# Patient Record
Sex: Female | Born: 1935 | Race: White | Hispanic: No | Smoking: Former smoker
Health system: Southern US, Community
[De-identification: ages and names within clinical notes are randomized; demographics above are authoritative.]

## PROBLEM LIST (undated history)

## (undated) DIAGNOSIS — J45909 Unspecified asthma, uncomplicated: Secondary | ICD-10-CM

## (undated) DIAGNOSIS — J449 Chronic obstructive pulmonary disease, unspecified: Secondary | ICD-10-CM

## (undated) DIAGNOSIS — M1990 Unspecified osteoarthritis, unspecified site: Secondary | ICD-10-CM

## (undated) DIAGNOSIS — F0390 Unspecified dementia without behavioral disturbance: Secondary | ICD-10-CM

## (undated) DIAGNOSIS — I10 Essential (primary) hypertension: Secondary | ICD-10-CM

## (undated) HISTORY — PX: ABDOMINAL HYSTERECTOMY: SHX81

## (undated) HISTORY — PX: BREAST SURGERY: SHX581

## (undated) HISTORY — PX: APPENDECTOMY: SHX54

## (undated) HISTORY — PX: SPINE SURGERY: SHX786

## (undated) HISTORY — PX: APPENDECTOMY (OPEN): SHX54

## (undated) HISTORY — PX: HYSTERECTOMY: SHX81

---

## 2005-07-26 ENCOUNTER — Ambulatory Visit: Payer: Self-pay | Admitting: Cardiology

## 2005-08-10 ENCOUNTER — Ambulatory Visit: Payer: Self-pay | Admitting: Cardiology

## 2005-08-27 ENCOUNTER — Ambulatory Visit: Payer: Self-pay | Admitting: Cardiology

## 2011-06-26 ENCOUNTER — Emergency Department (HOSPITAL_COMMUNITY)
Admission: EM | Admit: 2011-06-26 | Discharge: 2011-06-26 | Disposition: A | Discharge status: Home / Self Care | Payer: Medicare Other | Attending: Emergency Medicine | Admitting: Emergency Medicine

## 2011-06-26 ENCOUNTER — Emergency Department (HOSPITAL_COMMUNITY): Payer: Medicare Other

## 2011-06-26 DIAGNOSIS — W19XXXA Unspecified fall, initial encounter: Secondary | ICD-10-CM

## 2011-06-26 DIAGNOSIS — M5030 Other cervical disc degeneration, unspecified cervical region: Secondary | ICD-10-CM | POA: Insufficient documentation

## 2011-06-26 DIAGNOSIS — M79609 Pain in unspecified limb: Secondary | ICD-10-CM | POA: Insufficient documentation

## 2011-06-26 DIAGNOSIS — S0101XA Laceration without foreign body of scalp, initial encounter: Secondary | ICD-10-CM

## 2011-06-26 DIAGNOSIS — IMO0002 Reserved for concepts with insufficient information to code with codable children: Secondary | ICD-10-CM | POA: Insufficient documentation

## 2011-06-26 DIAGNOSIS — G319 Degenerative disease of nervous system, unspecified: Secondary | ICD-10-CM | POA: Insufficient documentation

## 2011-06-26 DIAGNOSIS — Z23 Encounter for immunization: Secondary | ICD-10-CM | POA: Insufficient documentation

## 2011-06-26 DIAGNOSIS — R079 Chest pain, unspecified: Secondary | ICD-10-CM | POA: Insufficient documentation

## 2011-06-26 DIAGNOSIS — S0100XA Unspecified open wound of scalp, initial encounter: Secondary | ICD-10-CM | POA: Insufficient documentation

## 2011-06-26 DIAGNOSIS — N949 Unspecified condition associated with female genital organs and menstrual cycle: Secondary | ICD-10-CM | POA: Insufficient documentation

## 2011-06-26 DIAGNOSIS — W010XXA Fall on same level from slipping, tripping and stumbling without subsequent striking against object, initial encounter: Secondary | ICD-10-CM | POA: Insufficient documentation

## 2011-06-26 DIAGNOSIS — S20219A Contusion of unspecified front wall of thorax, initial encounter: Secondary | ICD-10-CM

## 2011-06-26 DIAGNOSIS — S6000XA Contusion of unspecified finger without damage to nail, initial encounter: Secondary | ICD-10-CM | POA: Insufficient documentation

## 2011-06-26 DIAGNOSIS — Z7982 Long term (current) use of aspirin: Secondary | ICD-10-CM | POA: Insufficient documentation

## 2011-06-26 MED ORDER — TETANUS-DIPHTH-ACELL PERTUSSIS 5-2.5-18.5 LF-MCG/0.5 IM SUSP
0.5000 mL | Freq: Once | INTRAMUSCULAR | Status: AC
  Administered 2011-06-26: 0.5 mL via INTRAMUSCULAR
  Filled 2011-06-26: qty 0.5

## 2011-06-26 NOTE — ED Notes (Signed)
EDP in . Pt removed from back board. Collar left in place.

## 2011-06-26 NOTE — ED Notes (Signed)
Patient assisted with female urinal. No other needs voiced at this time.

## 2011-06-26 NOTE — ED Provider Notes (Signed)
History     CSN: [id number]  Arrival date & time 06/26/11  1242   First MD Initiated Contact with Patient 06/26/11 1325      Chief Complaint  Patient presents with  . Fall    (Consider location/radiation/quality/duration/timing/severity/associated sxs/prior treatment) Patient is a 78 y.o. female presenting with fall. The history is provided by the patient.  Fall  She tripped and fell as she was getting into her car and struck her head causing a laceration. She is complaining of pain in the left rib cage and also bruising to the left fourth finger. She denies loss of consciousness. Pain is moderate she rates it 5/10. She denies any nausea or vomiting. She does not on her last tetanus immunization was. She denies back pain and abdominal pain. She was treated by EMS by a full spinal immobilization and stabilization for transport.  No past medical history on file.  No past surgical history on file.  No family history on file.  History  Substance Use Topics  . Smoking status: Not on file  . Smokeless tobacco: Not on file  . Alcohol Use: Not on file    OB History    No data available      Review of Systems  All other systems reviewed and are negative.    Allergies  Review of patient's allergies indicates not on file.  Home Medications  No current outpatient prescriptions on file.  BP 157/85  Pulse 104  Temp(Src) 97.4 F (36.3 C) (Oral)  Resp 18  Ht 5\' 2"  (1.575 m)  Wt 127 lb (57.607 kg)  BMI 23.23 kg/m2  SpO2 96%  Physical Exam  Nursing note and vitals reviewed. HENT:  Head:     78 year old female who is on a long spine board with stiff cervical collar in place. She is in no acute distress. No signs are significant for mild hypertension blood pressure 157/85, mild tachycardia with heart rate 104. Oxygen saturation is 96% which is normal. Head: There is a laceration in the left temporoparietal area with no other head trauma seen. PERRLA, EOMI. Oropharynx is  clear. No facial injuries seen. Neck is nontender. Back  has no tenderness along the thoracic or lumbar spine and there is no CVA tenderness. Lungs are clear without rales, wheezes, rhonchi. Chest has moderate tenderness throughout the left lower rib cage both anteriorly, posteriorly, and laterally a. Has regular rate and rhythm without murmur. Abdomen is soft, flat, nontender without masses or hepatosplenomegaly. There is mild tenderness to palpation over the pelvic brim. Extremities: Ecchymosis is noted over the left fourth finger middle phalanx with out any significant tenderness. No deformities are seen. There is a minor abrasion over the left fifth finger PIP joint. There is no cyanosis or edema. Skin: There is a hyperkeratotic rash of the right foot and ankle, and no other rashes are seen. Neurologic: Mental status is normal, cranial nerves are intact, there no focal motor or sensory deficits. Psychiatric: No abnormalities of mood or affect.  ED Course  LACERATION REPAIR Date/Time: 06/26/2011 4:44 PM Performed by: Dione Booze Authorized by: Preston Fleeting, Ollie Esty Consent: Verbal consent obtained. Written consent not obtained. Risks and benefits: risks, benefits and alternatives were discussed Consent given by: patient Patient understanding: patient states understanding of the procedure being performed Patient consent: the patient's understanding of the procedure matches consent given Procedure consent: procedure consent matches procedure scheduled Relevant documents: relevant documents present and verified Test results: test results available and properly labeled Site marked: the  operative site was marked Imaging studies: imaging studies available Required items: required blood products, implants, devices, and special equipment available Patient identity confirmed: verbally with patient and arm band Time out: Immediately prior to procedure a "time out" was called to verify the correct patient,  procedure, equipment, support staff and site/side marked as required. Body area: head/neck Location details: scalp Laceration length: 2 cm Foreign bodies: no foreign bodies Nerve involvement: none Vascular damage: no Patient sedated: no Preparation: Patient was prepped and draped in the usual sterile fashion. Amount of cleaning: standard Debridement: none Degree of undermining: none Skin closure: staples Number of sutures: 4 Technique: simple Approximation: close Approximation difficulty: simple Dressing: pressure dressing Patient tolerance: Patient tolerated the procedure well with no immediate complications. Comments: When the pressure dressing was removed, she was noted to ask he have 2 parallel lacerations with significant contusion of the skin. Underlying hematoma was expressed out through the laceration and then the laceration was closed. She continued to have oozing of blood in spite of stapling so a pressure dressing was reapplied. She does take daily aspirin which is probably the reason that she is continuing to have bleeding in spite of good closure.   (including critical care time)  No results found for this or any previous visit. Dg Ribs Unilateral W/chest Left  06/26/2011  *RADIOLOGY REPORT*  Clinical Data: History of fall with left anterior rib pain.  LEFT RIBS AND CHEST - 3+ VIEW  Comparison: None.  Findings: Lung volumes are low, and there is mild elevation of the right hemidiaphragm.  No consolidative airspace disease.  No pleural effusions.  Mild diffuse interstitial prominence, with mild bronchial wall thickening (the chronicity of this finding is uncertain).  Heart size is normal.  Atherosclerosis of the thoracic aorta.  Three dedicated views of the left ribs demonstrate no definite acute displaced left rib rib fractures.  There is a well-formed callus in the lateral aspect of the left tenth rib, consistent with an old healed fracture.  Extensive costochondral  calcification noted.  IMPRESSION:  1.  No definite acute displaced left rib fractures.  An old healed fracture of the lateral aspect of the left tenth rib is noted.  If there continues be clinical concern for fracture, despite these negative findings, repeat radiographs in 10-14 days may be of use, as acute nondisplaced rib fractures can be radiographically occult on initial images. 2.  Interstitial prominence and mild bronchial wall thickening throughout the lungs bilaterally.  The chronicity of these findings is uncertain, but in an individual of this age, these findings may be chronic.  However, correlation for signs and symptoms of acute bronchitis is recommended. 3.  Atherosclerosis.  Original Report Authenticated By: Florencia Reasons, M.D.   Dg Pelvis 1-2 Views  06/26/2011  *RADIOLOGY REPORT*  Clinical Data: History of fall with pain.  PELVIS - 1-2 VIEW  Comparison: None.  Findings: Single frontal view of the pelvis is limited in the upper pelvis by overlying bowel gas.  Well visualized portions of the bony pelvis appear intact.  Bilateral proximal femurs appear intact, and the femoral heads project over the acetabulum (proper location cannot be confirmed on this single frontal view). Joint space narrowing, subchondral sclerosis and osteophyte formation in the hip joints bilaterally consistent with osteoarthritis.  IMPRESSION:  1.  No acute abnormality of the bony pelvis noted. 2.  Osteoarthritis of the hip joints bilaterally.  Original Report Authenticated By: Florencia Reasons, M.D.   Ct Head Wo Contrast  06/26/2011  *  RADIOLOGY REPORT*  Clinical Data:  History of fall hitting the left side of the head. Head pain.  Denies loss of consciousness.  CT HEAD WITHOUT CONTRAST CT CERVICAL SPINE WITHOUT CONTRAST  Technique:  Multidetector CT imaging of the head and cervical spine was performed following the standard protocol without intravenous contrast.  Multiplanar CT image reconstructions of the cervical  spine were also generated.  Comparison:  None  CT HEAD  Findings: Large high attenuation collection in the left frontal scalp, consistent with a large scalp hematoma.  No underlying displaced skull fracture.  No acute intracranial abnormality. Specifically, no evidence of intra or extra-axial hemorrhage, mass, mass effect, hydrocephalus or overt changes of acute ischemia. Extensive cerebral and cerebellar atrophy noted.  Areas of decreased attenuation in the deep and periventricular white matter of the cerebral hemispheres bilaterally is most consistent with chronic microvascular ischemic changes.  Mild mucosal thickening in the ethmoid sinuses and the sphenoid sinuses bilaterally.  Mastoids are well aerated.  IMPRESSION:  1.  Large left frontal scalp hematoma without underlying displaced skull fracture or other acute intracranial abnormality. 2.  Cerebral and cerebellar atrophy, age appropriate. 3.  Chronic microvascular ischemic changes in the white matter, as above.  CT CERVICAL SPINE  Findings: No definite acute fracture or malalignment is noted. There is minimal anterolisthesis of C6 upon C7, likely chronic. Multilevel degenerative disc disease as demonstrated by disc space narrowing and sclerosis of the adjacent vertebral body endplates, most severe at C4-C5 and C5-C6.  Multilevel facet arthropathy.  At C4-C5 and there is a prominent posterior disc osteophyte complex which slightly narrows the central canal.  Some calcification of the dura is noted (best demonstrated and C7-T1 posteriorly).  IMPRESSION:  1.  No acute fracture or malalignment. 2.  Multilevel degenerative disc disease and cervical spondylosis, as detailed above.  Original Report Authenticated By: Florencia Reasons, M.D.   Ct Cervical Spine Wo Contrast  06/26/2011  *RADIOLOGY REPORT*  Clinical Data:  History of fall hitting the left side of the head. Head pain.  Denies loss of consciousness.  CT HEAD WITHOUT CONTRAST CT CERVICAL SPINE WITHOUT  CONTRAST  Technique:  Multidetector CT imaging of the head and cervical spine was performed following the standard protocol without intravenous contrast.  Multiplanar CT image reconstructions of the cervical spine were also generated.  Comparison:  None  CT HEAD  Findings: Large high attenuation collection in the left frontal scalp, consistent with a large scalp hematoma.  No underlying displaced skull fracture.  No acute intracranial abnormality. Specifically, no evidence of intra or extra-axial hemorrhage, mass, mass effect, hydrocephalus or overt changes of acute ischemia. Extensive cerebral and cerebellar atrophy noted.  Areas of decreased attenuation in the deep and periventricular white matter of the cerebral hemispheres bilaterally is most consistent with chronic microvascular ischemic changes.  Mild mucosal thickening in the ethmoid sinuses and the sphenoid sinuses bilaterally.  Mastoids are well aerated.  IMPRESSION:  1.  Large left frontal scalp hematoma without underlying displaced skull fracture or other acute intracranial abnormality. 2.  Cerebral and cerebellar atrophy, age appropriate. 3.  Chronic microvascular ischemic changes in the white matter, as above.  CT CERVICAL SPINE  Findings: No definite acute fracture or malalignment is noted. There is minimal anterolisthesis of C6 upon C7, likely chronic. Multilevel degenerative disc disease as demonstrated by disc space narrowing and sclerosis of the adjacent vertebral body endplates, most severe at C4-C5 and C5-C6.  Multilevel facet arthropathy.  At C4-C5 and there is  a prominent posterior disc osteophyte complex which slightly narrows the central canal.  Some calcification of the dura is noted (best demonstrated and C7-T1 posteriorly).  IMPRESSION:  1.  No acute fracture or malalignment. 2.  Multilevel degenerative disc disease and cervical spondylosis, as detailed above.  Original Report Authenticated By: Florencia Reasons, M.D.   Dg Hand Complete  Left  06/26/2011  *RADIOLOGY REPORT*  Clinical Data: History of fall complaining of left-sided hand pain.  LEFT HAND - COMPLETE 3+ VIEW  Comparison: None.  Findings: Mild osteopenia.  Multi focal joint space narrowing, subchondral sclerosis, osteophyte formation and subchondral cyst formation, most pronounced in the DIP joints, as well as the first carpal metacarpal joint and metacarpal phalangeal joint.  No displaced fracture or dislocation identified.  IMPRESSION:  1.  No acute fracture or dislocation. 2.  Degenerative changes of osteoarthritis, as above.  Original Report Authenticated By: Florencia Reasons, M.D.    Blood started spurting from her wound, so a pressure dressing was applied. She's returned from CT scan with no obvious injury other than the laceration, and a hematoma was noted at the site of the laceration.  She was reevaluated after the laceration repair and pressure dressing. There is no significant bleeding at the site and she will be sent home with a pressure dressing in place with instructions to remove it in the morning.   1. Fall   2. Scalp laceration   3. Contusion of chest wall       MDM  Fall with scalp laceration, chest wall injury, and ecchymosis of the finger of left hand. X-rays will be obtained. No indication of syncope or neurologic event.        Dione Booze, MD 06/26/11 1736

## 2011-06-26 NOTE — ED Notes (Signed)
Pt states she was attempting to get into her car and fell into it. Complain of pain in left rib area. Per EMS, pt has a large lac to the side of her head.

## 2011-06-26 NOTE — ED Notes (Signed)
Pressure dressing placed on patients head per MD request.

## 2011-06-26 NOTE — ED Notes (Signed)
Pt over for x-rays

## 2011-07-01 ENCOUNTER — Encounter (HOSPITAL_COMMUNITY): Payer: Self-pay

## 2016-11-11 ENCOUNTER — Emergency Department (HOSPITAL_COMMUNITY)
Admission: EM | Admit: 2016-11-11 | Discharge: 2016-11-11 | Disposition: A | Discharge status: Home / Self Care | Payer: Medicare Other | Attending: Emergency Medicine | Admitting: Emergency Medicine

## 2016-11-11 ENCOUNTER — Encounter (HOSPITAL_COMMUNITY): Payer: Self-pay | Admitting: Emergency Medicine

## 2016-11-11 ENCOUNTER — Emergency Department (HOSPITAL_COMMUNITY): Payer: Medicare Other

## 2016-11-11 DIAGNOSIS — J449 Chronic obstructive pulmonary disease, unspecified: Secondary | ICD-10-CM | POA: Insufficient documentation

## 2016-11-11 DIAGNOSIS — J45909 Unspecified asthma, uncomplicated: Secondary | ICD-10-CM | POA: Diagnosis not present

## 2016-11-11 DIAGNOSIS — Z7982 Long term (current) use of aspirin: Secondary | ICD-10-CM | POA: Insufficient documentation

## 2016-11-11 DIAGNOSIS — Z87891 Personal history of nicotine dependence: Secondary | ICD-10-CM | POA: Diagnosis not present

## 2016-11-11 DIAGNOSIS — M79672 Pain in left foot: Secondary | ICD-10-CM

## 2016-11-11 DIAGNOSIS — Z79899 Other long term (current) drug therapy: Secondary | ICD-10-CM | POA: Diagnosis not present

## 2016-11-11 HISTORY — DX: Unspecified osteoarthritis, unspecified site: M19.90

## 2016-11-11 HISTORY — DX: Unspecified asthma, uncomplicated: J45.909

## 2016-11-11 HISTORY — DX: Chronic obstructive pulmonary disease, unspecified: J44.9

## 2016-11-11 MED ORDER — IBUPROFEN 400 MG PO TABS: 400.0000 mg | ORAL_TABLET | Freq: Three times a day (TID) | ORAL | 0 refills | Status: AC | PRN

## 2016-11-11 MED ORDER — PREDNISONE 20 MG PO TABS: 20.0000 mg | ORAL_TABLET | Freq: Every day | ORAL | 0 refills | Status: AC

## 2016-11-11 MED ORDER — IBUPROFEN 400 MG PO TABS
400.0000 mg | ORAL_TABLET | Freq: Once | ORAL | Status: AC
Start: 2016-11-11 — End: 2016-11-11
  Administered 2016-11-11: 400 mg via ORAL
  Filled 2016-11-11: qty 1

## 2016-11-11 MED ORDER — PREDNISONE 20 MG PO TABS
40.0000 mg | ORAL_TABLET | Freq: Once | ORAL | Status: AC
  Administered 2016-11-11: 40 mg via ORAL
  Filled 2016-11-11: qty 2

## 2016-11-11 NOTE — ED Notes (Signed)
Pt unable to point or state where pain is located.  + pedal pulses in bilateral feet

## 2016-11-11 NOTE — ED Provider Notes (Signed)
[facility]-EMERGENCY DEPT Provider Note   CSN: [id number] Arrival date & time: 11/11/16  1356     History   Chief Complaint Chief Complaint  Patient presents with  . Foot Pain    HPI Jaime Bowman is a 83 y.o. female.  HPI   89yF with LLE pain. Daughter and care taker supplementing history. Pt began complaining of pain in L foot yesterday. Today having a lot of difficulty bearing weight because of pain. Uses walker at baseline. No trauma. No swelling or rash. No acute numbness or tingling. She has a hard time localizing the pain when I ask her. "It moves around." Daughter reports she reported pain in various locations in b/l LE but L foot is what she has been saying the most.   Past Medical History:  Diagnosis Date  . Arthritis   . Asthma   . COPD (chronic obstructive pulmonary disease) (HCC)     There are no active problems to display for this patient.   Past Surgical History:  Procedure Laterality Date  . ABDOMINAL HYSTERECTOMY    . APPENDECTOMY    . BREAST SURGERY Left     OB History    No data available       Home Medications    Prior to Admission medications   Medication Sig Start Date End Date Taking? Authorizing Provider  acetaminophen (TYLENOL) 500 MG tablet Take 1,000 mg by mouth every 6 (six) hours as needed. For pain   Yes [provider]  amLODipine (NORVASC) 5 MG tablet Take 5 mg by mouth daily.   Yes [provider]  aspirin EC 81 MG tablet Take 81 mg by mouth daily.   Yes [provider]  HYDROcodone-acetaminophen (NORCO) 10-325 MG per tablet Take 1 tablet by mouth every 8 (eight) hours as needed. For pain   Yes [provider]  ibuprofen (ADVIL,MOTRIN) 200 MG tablet Take 400 mg by mouth every 6 (six) hours as needed. For pain   Yes [provider]  isosorbide mononitrate (IMDUR) 30 MG 24 hr tablet Take 30 mg by mouth daily.   Yes [provider]  MYRBETRIQ 50 MG TB24 tablet Take 50 mg by mouth  daily.  09/24/16  Yes [provider]  VENTOLIN HFA 108 (90 Base) MCG/ACT inhaler Inhale 1-2 puffs into the lungs every 4 (four) hours as needed.  09/24/16  Yes [provider]    Family History No family history on file.  Social History Social History  Substance Use Topics  . Smoking status: Former Games developermoker  . Smokeless tobacco: Never Used  . Alcohol use Yes     Allergies   Food   Review of Systems Review of Systems  All systems reviewed and negative, other than as noted in HPI.  Physical Exam Updated Vital Signs BP (!) 135/52 (BP Location: Right Arm)   Pulse 94   Temp 99.1 F (37.3 C) (Oral)   Resp 18   Ht 5\' 2"  (1.575 m)   Wt 57.6 kg (127 lb)   SpO2 97%   BMI 23.23 kg/m   Physical Exam  Constitutional: She appears well-developed and well-nourished. No distress.  HENT:  Head: Normocephalic and atraumatic.  Eyes: Conjunctivae are normal. Right eye exhibits no discharge. Left eye exhibits no discharge.  Neck: Neck supple.  Cardiovascular: Normal rate, regular rhythm and normal heart sounds.  Exam reveals no gallop and no friction rub.   No murmur heard. Pulmonary/Chest: Effort normal. No respiratory distress. She has  wheezes.  Abdominal: Soft. She exhibits no distension. There is no tenderness.  Musculoskeletal: She exhibits no edema or tenderness.       Feet:  L foot is tender plantar surface in pictures area. No appreciable swelling. No redness, ecchymosis or other skin findings. NVI. No increased pain with ROM of ankle. Pain is exacerbated with dorsiflexion of toes.   Neurological: She is alert.  Skin: Skin is warm and dry.  Psychiatric: She has a normal mood and affect. Her behavior is normal. Thought content normal.  Nursing note and vitals reviewed.    ED Treatments / Results  Labs (all labs ordered are listed, but only abnormal results are displayed) Labs Reviewed - No data to display  EKG  EKG Interpretation None        Radiology No results found.   Dg Foot Complete Left  Result Date: 11/11/2016 CLINICAL DATA:  83 year old female with a history of left foot pain and no known injury EXAM: LEFT FOOT - COMPLETE 3+ VIEW COMPARISON:  None. FINDINGS: Osteopenia. No acute displaced fracture. Degenerative changes of the interphalangeal joints. No subluxation/ dislocation. Dense calcifications of the tibial and pedal vessels. Degenerative changes of the hindfoot and midfoot. IMPRESSION: Osteopenia, with no evidence of acute bony abnormality. Calcifications of the tibial and pedal vessels. Correlation with noninvasive arterial imaging may be useful if there is a concern for critical limb ischemia as an etiology of foot pain. Electronically Signed   By: Gilmer Mor D.O.   On: 11/11/2016 15:31    Procedures Procedures (including critical care time)  Medications Ordered in ED Medications - No data to display   Initial Impression / Assessment and Plan / ED Course  I have reviewed the triage vital signs and the nursing notes.  Pertinent labs & imaging results that were available during my care of the patient were reviewed by me and considered in my medical decision making (see chart for details).     89yF with atraumatic L foot pain. Plantar fasciitis? No trauma. Tender in expect location. NVI. Imaging w/o acute abnormality. Her exam is not consistent with arterial insufficiency. Also noted to having wheezing, but she seems comfortable and denies acute respiratory complaint. Imaging negative. Walking in ED with a walker. Appears reasonably comfortable doing this.   Final Clinical Impressions(s) / ED Diagnoses   Final diagnoses:  Left foot pain    New Prescriptions New Prescriptions   No medications on file     Raeford Razor, MD 11/15/16 1045

## 2016-11-11 NOTE — ED Triage Notes (Signed)
Pt reports l foot pain for a couple of weeks-  Assisted out of car with weight bearing to her L foot Pt is frail and with 2 daughters  Dr [place]arm [place]

## 2016-11-11 NOTE — ED Triage Notes (Signed)
Patient complains of left foot pain that started yesterday. Patient says that she is unable to walk due to the pain. NAD.

## 2016-11-12 NOTE — Care Management (Signed)
VM received on 6/18 for consult over weekend, chart reviewed, no description of CM need in consult order. No orders pertinent to CM. No completed note from EDP to identify CM needs. Pt discharged from ED 11/11/2016

## 2017-02-19 ENCOUNTER — Emergency Department: Payer: Medicare Other

## 2017-02-19 ENCOUNTER — Observation Stay
Admission: EM | Admit: 2017-02-19 | Discharge: 2017-02-20 | Disposition: A | Discharge status: Home Health | Payer: Medicare Other | Source: Ambulatory Visit | Attending: Internal Medicine | Admitting: Internal Medicine

## 2017-02-19 DIAGNOSIS — J45909 Unspecified asthma, uncomplicated: Secondary | ICD-10-CM

## 2017-02-19 DIAGNOSIS — Z87891 Personal history of nicotine dependence: Secondary | ICD-10-CM

## 2017-02-19 DIAGNOSIS — I10 Essential (primary) hypertension: Secondary | ICD-10-CM | POA: Insufficient documentation

## 2017-02-19 DIAGNOSIS — J181 Lobar pneumonia, unspecified organism: Secondary | ICD-10-CM

## 2017-02-19 DIAGNOSIS — M1990 Unspecified osteoarthritis, unspecified site: Secondary | ICD-10-CM | POA: Insufficient documentation

## 2017-02-19 DIAGNOSIS — J189 Pneumonia, unspecified organism: Principal | ICD-10-CM | POA: Diagnosis present

## 2017-02-19 DIAGNOSIS — Z9049 Acquired absence of other specified parts of digestive tract: Secondary | ICD-10-CM | POA: Insufficient documentation

## 2017-02-19 DIAGNOSIS — J441 Chronic obstructive pulmonary disease with (acute) exacerbation: Secondary | ICD-10-CM | POA: Insufficient documentation

## 2017-02-19 DIAGNOSIS — Z79899 Other long term (current) drug therapy: Secondary | ICD-10-CM | POA: Insufficient documentation

## 2017-02-19 DIAGNOSIS — F0390 Unspecified dementia without behavioral disturbance: Secondary | ICD-10-CM | POA: Insufficient documentation

## 2017-02-19 DIAGNOSIS — R0789 Other chest pain: Secondary | ICD-10-CM | POA: Insufficient documentation

## 2017-02-19 DIAGNOSIS — Z90710 Acquired absence of both cervix and uterus: Secondary | ICD-10-CM | POA: Insufficient documentation

## 2017-02-19 HISTORY — DX: Unspecified dementia, unspecified severity, without behavioral disturbance, psychotic disturbance, mood disturbance, and anxiety: F03.90

## 2017-02-19 HISTORY — DX: Essential (primary) hypertension: I10

## 2017-02-19 HISTORY — DX: Unspecified asthma, uncomplicated: J45.909

## 2017-02-19 HISTORY — DX: Unspecified osteoarthritis, unspecified site: M19.90

## 2017-02-19 LAB — COMPREHENSIVE METABOLIC PANEL
ALT: 8 U/L (ref 0–55)
AST (SGOT): 14 U/L (ref 10–42)
Albumin/Globulin Ratio: 0.71 Ratio (ref 0.70–1.50)
Albumin: 3.4 gm/dL — ABNORMAL LOW (ref 3.5–5.0)
Alkaline Phosphatase: 69 U/L (ref 40–145)
Anion Gap: 12.2 mMol/L (ref 7.0–18.0)
BUN / Creatinine Ratio: 21.4 Ratio (ref 10.0–30.0)
BUN: 18 mg/dL (ref 7–22)
Bilirubin, Total: 0.5 mg/dL (ref 0.1–1.2)
CO2: 26 mMol/L (ref 20.0–30.0)
Calcium: 9.4 mg/dL (ref 8.5–10.5)
Chloride: 106 mMol/L (ref 98–110)
Creatinine: 0.84 mg/dL (ref 0.60–1.20)
EGFR: 58 mL/min/{1.73_m2} — ABNORMAL LOW (ref 60–150)
Globulin: 4.8 gm/dL — ABNORMAL HIGH (ref 2.0–4.0)
Glucose: 94 mg/dL (ref 71–99)
Osmolality Calc: 282 mOsm/kg (ref 275–300)
Potassium: 3.8 mMol/L (ref 3.5–5.3)
Protein, Total: 8.2 gm/dL (ref 6.0–8.3)
Sodium: 140 mMol/L (ref 136–147)

## 2017-02-19 LAB — CBC AND DIFFERENTIAL
Bands: 1 % (ref 0–10)
Basophils %: 0 % (ref 0.0–3.0)
Basophils Absolute: 0 10*3/uL (ref 0.0–0.3)
Eosinophils %: 0 % (ref 0.0–7.0)
Eosinophils Absolute: 0 10*3/uL (ref 0.0–0.8)
Hematocrit: 33.1 % — ABNORMAL LOW (ref 36.0–48.0)
Hemoglobin: 11.1 gm/dL — ABNORMAL LOW (ref 12.0–16.0)
Lymphocytes Absolute: 2.7 10*3/uL (ref 0.6–5.1)
Lymphocytes: 50 % — ABNORMAL HIGH (ref 15.0–46.0)
MCH: 26 pg — ABNORMAL LOW (ref 28–35)
MCHC: 33 gm/dL (ref 31–36)
MCV: 78 fL — ABNORMAL LOW (ref 80–100)
MPV: 6.8 fL (ref 6.0–10.0)
Monocytes Absolute: 0.6 10*3/uL (ref 0.1–1.7)
Monocytes: 11 % (ref 3.0–15.0)
Neutrophils %: 38 % — ABNORMAL LOW (ref 42.0–78.0)
Neutrophils Absolute: 2.1 10*3/uL (ref 1.7–8.6)
PLT CT: 190 10*3/uL (ref 130–440)
RBC: 4.23 10*6/uL (ref 3.80–5.00)
RDW: 13.9 % (ref 10.5–14.5)
WBC: 5.3 10*3/uL (ref 4.0–11.0)

## 2017-02-19 LAB — VH URINALYSIS WITH MICROSCOPIC AND CULTURE IF INDICATED
Bilirubin, UA: NEGATIVE mg/dL
Blood, UA: NEGATIVE mg/dL
Glucose, UA: NEGATIVE mg/dL
Ketones UA: NEGATIVE mg/dL
Leukocyte Esterase, UA: NEGATIVE Leu/uL
Nitrite, UA: NEGATIVE
Protein, UR: NEGATIVE mg/dL
Urine Specific Gravity: 1.01 (ref 1.001–1.040)
Urobilinogen, UA: 0.2 mg/dL
pH, Urine: 6 pH (ref 5.0–8.0)

## 2017-02-19 LAB — ECG 12-LEAD
P Wave Axis: 1 deg
P-R Interval: 157 ms
Patient Age: 89 years
Q-T Interval(Corrected): 466 ms
Q-T Interval: 384 ms
QRS Axis: -56 deg
QRS Duration: 111 ms
T Axis: 63 years
Ventricular Rate: 88 //min

## 2017-02-19 LAB — TROPONIN I: Troponin I: 0 ng/mL (ref 0.00–0.02)

## 2017-02-19 LAB — D-DIMER, QUANTITATIVE: D-Dimer: 1.49 mg/L FEU — ABNORMAL HIGH (ref 0.19–0.52)

## 2017-02-19 LAB — B-TYPE NATRIURETIC PEPTIDE: B-Natriuretic Peptide: 64.2 pg/mL (ref 0.0–100.0)

## 2017-02-19 MED ORDER — CEFTRIAXONE SODIUM-DEXTROSE 1-3.74 GM-% IV SOLR
1.0000 g | INTRAVENOUS | Status: DC
Start: 2017-02-20 — End: 2017-02-19

## 2017-02-19 MED ORDER — ASPIRIN 81 MG PO TBEC
81.0000 mg | DELAYED_RELEASE_TABLET | Freq: Every day | ORAL | Status: DC
Start: 2017-02-19 — End: 2017-02-20
  Administered 2017-02-20: 09:00:00 81 mg via ORAL
  Filled 2017-02-19: qty 1

## 2017-02-19 MED ORDER — IODIXANOL 320 MG/ML IV SOLN
90.0000 mL | Freq: Once | INTRAVENOUS | Status: AC | PRN
Start: 2017-02-19 — End: 2017-02-19
  Administered 2017-02-19: 13:00:00 90 mL via INTRAVENOUS

## 2017-02-19 MED ORDER — AZITHROMYCIN 500 MG IV SOLR
INTRAVENOUS | Status: AC
Start: 2017-02-19 — End: ?
  Filled 2017-02-19: qty 500

## 2017-02-19 MED ORDER — SODIUM CHLORIDE 0.9 % IV SOLN
INTRAVENOUS | Status: AC
Start: 2017-02-19 — End: ?
  Filled 2017-02-19: qty 100

## 2017-02-19 MED ORDER — ACETAMINOPHEN 325 MG PO TABS
650.0000 mg | ORAL_TABLET | ORAL | Status: DC | PRN
Start: 2017-02-19 — End: 2017-02-20

## 2017-02-19 MED ORDER — SODIUM CHLORIDE 0.9 % IV MBP
1.0000 g | INTRAVENOUS | Status: DC
Start: 2017-02-19 — End: 2017-02-19

## 2017-02-19 MED ORDER — METHYLPREDNISOLONE SODIUM SUCC 40 MG IJ SOLR
40.0000 mg | Freq: Two times a day (BID) | INTRAMUSCULAR | Status: DC
Start: 2017-02-19 — End: 2017-02-20
  Administered 2017-02-19 – 2017-02-20 (×2): 40 mg via INTRAVENOUS
  Filled 2017-02-19 (×2): qty 1

## 2017-02-19 MED ORDER — ENOXAPARIN SODIUM 40 MG/0.4ML SC SOLN
40.0000 mg | SUBCUTANEOUS | Status: DC
Start: 2017-02-19 — End: 2017-02-20
  Administered 2017-02-19: 18:00:00 40 mg via SUBCUTANEOUS
  Filled 2017-02-19: qty 0.4

## 2017-02-19 MED ORDER — ALBUTEROL-IPRATROPIUM 2.5-0.5 (3) MG/3ML IN SOLN
3.0000 mL | Freq: Four times a day (QID) | RESPIRATORY_TRACT | Status: DC
Start: 2017-02-19 — End: 2017-02-20
  Administered 2017-02-19 – 2017-02-20 (×4): 3 mL via RESPIRATORY_TRACT
  Filled 2017-02-19 (×4): qty 3

## 2017-02-19 MED ORDER — SODIUM CHLORIDE 0.9 % IV MBP
1.0000 g | INTRAVENOUS | Status: DC
Start: 2017-02-20 — End: 2017-02-20

## 2017-02-19 MED ORDER — SODIUM CHLORIDE 0.9 % IV SOLN
500.0000 mg | INTRAVENOUS | Status: DC
Start: 2017-02-19 — End: 2017-02-20
  Administered 2017-02-19: 14:00:00 500 mg via INTRAVENOUS

## 2017-02-19 MED ORDER — CEFTRIAXONE SODIUM 1 G IJ SOLR
INTRAMUSCULAR | Status: AC
Start: 2017-02-19 — End: ?
  Filled 2017-02-19: qty 1000

## 2017-02-19 MED ORDER — AMLODIPINE BESYLATE 5 MG PO TABS
5.0000 mg | ORAL_TABLET | Freq: Every day | ORAL | Status: DC
Start: 2017-02-19 — End: 2017-02-20
  Administered 2017-02-20: 09:00:00 5 mg via ORAL
  Filled 2017-02-19: qty 1

## 2017-02-19 MED ORDER — SODIUM CHLORIDE 0.9 % IJ SOLN
3.0000 mL | Freq: Three times a day (TID) | INTRAMUSCULAR | Status: DC
Start: 2017-02-19 — End: 2017-02-20
  Administered 2017-02-19 – 2017-02-20 (×3): 3 mL via INTRAVENOUS

## 2017-02-19 MED ORDER — ACETAMINOPHEN 160 MG/5ML PO SOLN
650.0000 mg | ORAL | Status: DC | PRN
Start: 2017-02-19 — End: 2017-02-20

## 2017-02-19 MED ORDER — ALBUTEROL SULFATE (2.5 MG/3ML) 0.083% IN NEBU
2.5000 mg | INHALATION_SOLUTION | Freq: Once | RESPIRATORY_TRACT | Status: AC
Start: 2017-02-19 — End: 2017-02-19
  Administered 2017-02-19: 12:00:00 2.5 mg via RESPIRATORY_TRACT

## 2017-02-19 MED ORDER — VH BIO-K PLUS PROBIOTIC 50 BIL CFU CAPSULE
50.0000 | DELAYED_RELEASE_CAPSULE | Freq: Every day | ORAL | Status: DC
Start: 2017-02-19 — End: 2017-02-20
  Administered 2017-02-19 – 2017-02-20 (×2): 50 via ORAL
  Filled 2017-02-19 (×2): qty 1

## 2017-02-19 MED ORDER — DEXTROSE 5 % IV SOLN
1.0000 g | Freq: Once | INTRAVENOUS | Status: AC
Start: 2017-02-19 — End: 2017-02-19
  Administered 2017-02-19: 16:00:00 1 g via INTRAVENOUS
  Filled 2017-02-19: qty 1000

## 2017-02-19 MED ORDER — VH BIO-K PLUS PROBIOTIC 50 BIL CFU CAPSULE
50.0000 | DELAYED_RELEASE_CAPSULE | Freq: Every day | ORAL | Status: DC
Start: 2017-02-19 — End: 2017-02-19

## 2017-02-19 MED ORDER — DOCUSATE SODIUM 100 MG PO CAPS
100.0000 mg | ORAL_CAPSULE | Freq: Every day | ORAL | Status: DC
Start: 2017-02-20 — End: 2017-02-20
  Administered 2017-02-20: 09:00:00 100 mg via ORAL
  Filled 2017-02-19: qty 1

## 2017-02-19 MED ORDER — ONDANSETRON HCL 4 MG/2ML IJ SOLN
4.0000 mg | Freq: Three times a day (TID) | INTRAMUSCULAR | Status: DC | PRN
Start: 2017-02-19 — End: 2017-02-20

## 2017-02-19 MED ORDER — ISOSORBIDE DINITRATE 10 MG PO TABS
5.0000 mg | ORAL_TABLET | Freq: Every morning | ORAL | Status: DC
Start: 2017-02-19 — End: 2017-02-20
  Administered 2017-02-20: 09:00:00 5 mg via ORAL
  Filled 2017-02-19: qty 1

## 2017-02-19 MED ORDER — ONDANSETRON 4 MG PO TBDP
4.0000 mg | ORAL_TABLET | Freq: Three times a day (TID) | ORAL | Status: DC | PRN
Start: 2017-02-19 — End: 2017-02-20

## 2017-02-19 MED ORDER — ALBUTEROL SULFATE (2.5 MG/3ML) 0.083% IN NEBU
INHALATION_SOLUTION | RESPIRATORY_TRACT | Status: AC
Start: 2017-02-19 — End: ?
  Filled 2017-02-19: qty 3

## 2017-02-19 MED ORDER — CEFTRIAXONE SODIUM-DEXTROSE 1-3.74 GM-% IV SOLR
1.0000 g | Freq: Once | INTRAVENOUS | Status: DC
Start: 2017-02-19 — End: 2017-02-19

## 2017-02-19 MED ORDER — ACETAMINOPHEN 650 MG RE SUPP
650.0000 mg | RECTAL | Status: DC | PRN
Start: 2017-02-19 — End: 2017-02-20

## 2017-02-19 MED ORDER — SENNOSIDES-DOCUSATE SODIUM 8.6-50 MG PO TABS
2.0000 | ORAL_TABLET | Freq: Every evening | ORAL | Status: DC
Start: 2017-02-19 — End: 2017-02-20
  Filled 2017-02-19: qty 2

## 2017-02-19 NOTE — ED Provider Notes (Signed)
Physician/Midlevel provider first contact with patient: 02/19/17 1125         History     Chief Complaint   Patient presents with   . Shortness of Breath   . Altered Mental Status     Patient brought to emergency room by family for evaluation of confusion. Patient was visiting the area for family reunion was in normal state of health until last night. Patient was noted last night to possibly could be confused in that they heard her talking in her room after she was down for the night. This morning however patient was noted to have increased work of breathing. She appeared to be short of breath and could have some wheezing as well. Patient also seen to be mildly confused to the family as well. Patient does have dementia over this seemed to be worse than her usual baseline dementia. Patient does not have any associated temperature, nausea, vomiting or diarrhea. Patient does have a cough. Patient does have a history of asthma as well. There is been no noted aggravating or alleviating factors. Patient when being transferred to the emergency room department began to complain of right lower leg pain when she was moved from the stretcher.      The history is provided by the patient and a relative.            Past Medical History:   Diagnosis Date   . Arthritis    . Dementia    . Hypertension        Past Surgical History:   Procedure Laterality Date   . APPENDECTOMY     . HYSTERECTOMY     . SPINE SURGERY         No family history on file.    Social  Social History   Substance Use Topics   . Smoking status: Former Games developer   . Smokeless tobacco: Never Used      Comment: per family quit at least 40 years ago   . Alcohol use Not on file       .     No Known Allergies    Home Medications     Med List Status:  Complete Set By: Lyda Perone, RN at 02/19/2017  4:32 PM                amLODIPine (NORVASC) 5 MG tablet     Take 5 mg by mouth daily.     aspirin EC 81 MG EC tablet     Take 81 mg by mouth daily.     isosorbide  dinitrate (ISORDIL) 5 MG tablet     Take 5 mg by mouth every morning.               Review of Systems   Constitutional: Negative for activity change, appetite change, chills and fever.   HENT: Negative for congestion, rhinorrhea and sore throat.    Respiratory: Positive for cough, shortness of breath and wheezing.    Cardiovascular: Positive for chest pain (mild right-sided chest pain).   Gastrointestinal: Negative for abdominal pain, diarrhea, nausea and vomiting.   Skin: Negative for rash.   Neurological: Negative for dizziness, syncope and light-headedness.   Psychiatric/Behavioral: Positive for confusion.   All other systems reviewed and are negative.      Physical Exam    BP: 143/79, Heart Rate: 79, Temp: 99 F (37.2 C), Resp Rate: 16, SpO2: 96 %, Weight: 59 kg    Physical Exam   Constitutional:  She appears well-developed and well-nourished. No distress.   HENT:   Head: Normocephalic and atraumatic.   Right Ear: External ear normal.   Left Ear: External ear normal.   Nose: Nose normal.   Mouth/Throat: Oropharynx is clear and moist. No oropharyngeal exudate.   Eyes: Pupils are equal, round, and reactive to light. Conjunctivae and EOM are normal. Right eye exhibits no discharge. Left eye exhibits no discharge.   Neck: Normal range of motion. Neck supple. No JVD present.   Cardiovascular: Normal rate, regular rhythm and normal heart sounds.  Exam reveals no gallop and no friction rub.    No murmur heard.  Pulmonary/Chest: She is in respiratory distress (increased work of breathing). She has wheezes.   Abdominal: Soft. She exhibits no distension and no mass. There is no tenderness. There is no rebound and no guarding.   Musculoskeletal: Normal range of motion. She exhibits tenderness (right calf without swelling or redness). She exhibits no edema.   Neurological: She is alert. No cranial nerve deficit or sensory deficit. She exhibits normal muscle tone.   Skin: Skin is warm and dry. No rash noted. She is not  diaphoretic.   Psychiatric: She has a normal mood and affect. Her behavior is normal.   Nursing note and vitals reviewed.        MDM and ED Course     ED Medication Orders     Start Ordered     Status Ordering Provider    02/19/17 1413 02/19/17 1412    Once in ED     Route: Intravenous  Ordered Dose: 1 g     Discontinued Chucky May    02/19/17 1413 02/19/17 1412    Daily     Route: Oral  Ordered Dose: 50 Billion CFU     Discontinued Chucky May    02/19/17 1401 02/19/17 1400    Once in ED     Route: Intravenous  Ordered Dose: 1 g     Discontinued Chucky May    02/19/17 1401 02/19/17 1400  azithromycin (ZITHROMAX) 500 mg in sodium chloride 0.9 % 250 mL IVPB  Every 24 hours     Route: Intravenous  Ordered Dose: 500 mg     Last MAR action:  Talbert Forest    02/19/17 1146 02/19/17 1145  albuterol (PROVENTIL) nebulizer solution 2.5 mg  RT - Once     Route: Nebulization  Ordered Dose: 2.5 mg     Last MAR action:  Given Chucky May           Labs     Results     Procedure Component Value Units Date/Time    B-type Natriuretic Peptide [[id number]] Collected:  02/19/17 1125    Specimen:  Blood Updated:  02/19/17 1323     B-Natriuretic Peptide 64.2 pg/mL     Urinalysis w Microscopic and Culture if Indicated [[id number]] Collected:  02/19/17 1215    Specimen:  Urine, Random Updated:  02/19/17 1228     Color, UA Yellow     Clarity, UA Clear     Specific Gravity, UR 1.010     pH, Urine 6.0 pH      Protein, UR Negative mg/dL      Glucose, UA Negative mg/dL      Ketones UA Negative mg/dL      Bilirubin, UA Negative mg/dL      Blood, UA Negative mg/dL      Nitrite, UA Negative  Urobilinogen, UA 0.2 mg/dL      Leukocyte Esterase, UA Negative Leu/uL     D-dimer, quantitative [[id number]]  (Abnormal) Collected:  02/19/17 1130    Specimen:  Blood Updated:  02/19/17 1223     D-Dimer 1.49 (H) mg/L FEU     CBC and differential [[id number]]  (Abnormal) Collected:  02/19/17 1130    Specimen:  Blood from Blood  Updated:  02/19/17 1207     WBC 5.3 K/cmm      RBC 4.23 M/cmm      Hemoglobin 11.1 (L) gm/dL      Hematocrit 30.8 (L) %      MCV 78 (L) fL      MCH 26 (L) pg      MCHC 33 gm/dL      RDW 65.7 %      PLT CT 190 K/cmm      MPV 6.8 fL      NEUTROPHIL % 38.0 (L) %      Lymphocytes 50.0 (H) %      Monocytes 11.0 %      Eosinophils % 0.0 %      Basophils % 0.0 %      Bands 1 %      Neutrophils Absolute 2.1 K/cmm      Lymphocytes Absolute 2.7 K/cmm      Monocytes Absolute 0.6 K/cmm      Eosinophils Absolute 0.0 K/cmm      BASO Absolute 0.0 K/cmm      RBC Morphology RBC Morphology Reviewed     Macrocytic 1+     Microcytic 1+     Anisocytosis 1+     Poikilocytosis 1+    Narrative:       Manual differential performed    Troponin I (Stat) [[id number]] Collected:  02/19/17 1130    Specimen:  Plasma Updated:  02/19/17 1200     Troponin I 0.00 ng/mL     Comprehensive metabolic panel [[id number]]  (Abnormal) Collected:  02/19/17 1130    Specimen:  Plasma Updated:  02/19/17 1155     Sodium 140 mMol/L      Potassium 3.8 mMol/L      Chloride 106 mMol/L      CO2 26.0 mMol/L      Calcium 9.4 mg/dL      Glucose 94 mg/dL      Creatinine 0.27 mg/dL      BUN 18 mg/dL      Protein, Total 8.2 gm/dL      Albumin 3.4 (L) gm/dL      Alkaline Phosphatase 69 U/L      ALT 8 U/L      AST (SGOT) 14 U/L      Bilirubin, Total 0.5 mg/dL      Albumin/Globulin Ratio 0.71 Ratio      Anion Gap 12.2 mMol/L      BUN/Creatinine Ratio 21.4 Ratio      EGFR 58 (L) mL/min/1.54m2      Osmolality Calc 282 mOsm/kg      Globulin 4.8 (H) gm/dL             Radiologic Studies  Radiology Results (24 Hour)     Procedure Component Value Units Date/Time    CT ANGIOGRAM CHEST [[id number]] Collected:  02/19/17 1314    Order Status:  Completed Updated:  02/19/17 1332    Narrative:       Clinical History:  Acute chest pain, heavy breathing, suspected pulmonary embolism    Examination:  Thin cut helical CT angiography was performed of the chest. An IV contrast bolus was timed to  provide maximal pulmonary arterial opacification. Axial, and 3-D MIP sagittal and coronal reformatted images were provided. Automatic exposure control was used   for dose reduction.    Contrast:  IODIXANOL 320 MG/ML IV SOLN/90 mL    Comparison:  None available.    Findings:  Heart size is normal.    Heavy atherosclerotic plaque is present in the aorta. There does appear to be some calcification at the aortic valve. The ascending thoracic aorta measures 3.1 cm in diameter. The descending aorta is tortuous and ectatic, measuring approximately 3.7 cm   in greatest diameter, with visualization limited by some motion artifact. There is a small dissection of the distal descending thoracic aorta. Part of the dissection flap is calcified, suggesting that this is old. The true and false lumens are both   opacified with contrast.    In the abdomen, there is a 3.1 cm infrarenal aorta. There is also an old appearing dissection of the infrarenal aorta. It is not completely imaged. The true and false lumens are well opacified.    There is severe narrowing of the origin of the superior mesenteric artery, which is well opacified beyond the stenosis. There is at least moderate narrowing of the origin of the celiac artery, which is also well opacified. There is also at least moderate   narrowing of the proximal left renal artery.    The main pulmonary artery is enlarged, measuring 3.4 cm in diameter. There is no evidence of pulmonary embolism, though motion artifact limits the evaluation, especially in the right lower lobe.    No pleural or pericardial effusions are seen. No enlarged thoracic lymph nodes are identified.    Mild airspace disease in the posterior right base is most suggestive of atelectasis, though inflammation cannot be excluded. The lungs are otherwise grossly clear, again with visualization limited by patient motion.    No acute abnormalities are seen in the imaged portion of the upper abdomen.    There is an  inferior endplate compression fracture T12, with approximately 25% anterior height loss. This is age-indeterminate, though probably subacute or older, as fracture lines are not clearly seen. Visualization is limited by angiographic technique.   A mild T3 compression fracture does appear to be old.      Impression:       1.  No evidence of pulmonary embolism, with evaluation of the posterior right base markedly limited by motion artifact.  2.  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  3.  Right lower lobe atelectasis versus inflammation.  4.  Enlarged main pulmonary artery, suggesting pulmonary arterial hypertension.  5.  Mild age indeterminate T12 compression fracture, likely subacute or older: Clinical correlation is recommended.  6.  Aortic valve calcification.    ReadingStation:[id number]    XR Chest AP Portable [[id number]] Collected:  02/19/17 1145    Order Status:  Completed Updated:  02/19/17 1147    Narrative:       Clinical History:  Reason For Exam: AMS  Confusion per family.    Examination:  Frontal view of the chest.    Comparison:  None available.    Findings:  The heart is normal in size. The aorta is dilated or tortuous. Dense aortic calcifications are noted. There is  no evidence of pneumothorax or drainable effusion. No confluent consolidation is seen. The bones appear osteopenic and there are mild scattered   bony degenerative changes.      Impression:       Atherosclerosis with a dilated or tortuous aorta.    ReadingStation:[id number]      .        MDM  12:44 PM  patient with elevated d-dimer so we'll investigate with CT angiogram. Patient currently is resting comfortably without chest pain or shortness of breath. Patient did have some improvement with her albuterol treatment.       Emergency department note summary: History of present illness:  Patient presents with shortness breath and wheezing. Patient is on a family reunion vacation. Patient does have a history of asthma. This morning patient was noticed to be mildly confused loss increased work of breathing. There is no she associated fever. Patient has had some mild chest pain at but is on the right side. Examination: Vital signs reviewed. Patient is afebrile. Lungs show diffuse wheezing and after treatment wheezing was still present.  Right calf has some mild tenderness but no edema or redness. Remainder exam is noncontributory.  Testing: H&H is low at 11 and 33. White count is normal at 5.3. Chemistries are essentially within normal limits. Troponin is 0. D-dimer is elevated at 1.49. Urinalysis is within normal range. BNP is within normal range. Chest x-ray does not show any acute changes. CT done shows no pulmonary emboli but does show some chronic changes which are not felt to be acute. The surgically the dissections are felt to be old and the patient is not having any acute pain symptoms are not felt to be related to an acute dissection. Also there is an inflammatory change noted in the right lower lung field.    Differential diagnosis includes but not limited to pulmonary emboli, acute coronary event, infectious process such as pneumonia, acute asthma exacerbation and metabolic derangement. Patient was found to be in mild respiratory compromise was given nebulized treatment upon arrival and she did improve with this. Serial monitoring the patient showed her to remain stable however there was some decrease in her oxygen saturation so she was placed on supplemental O2. Patient seemed to improve with this. Testing did show an elevation in her d-dimer so this was investigated. CT scan did not show any pulmonary emboli however it did show what appears to be pneumonia. This is felt to be consistent with patient having a presentation of pneumonia along with an asthma exacerbation. Therefore is felt  best for patient to be admitted for this for stabilization beginning of treatment. This was discussed with his family who is in agreement with treatment plan.        Procedures    Clinical Impression & Disposition     Clinical Impression  Final diagnoses:   Pneumonia of right lower lobe due to infectious organism   Acute asthma        ED Disposition     ED Disposition Condition Date/Time Comment    Observation  Tue Feb 19, 2017  3:47 PM Admitting Physician: Richrd Sox [[id number]]   Diagnosis: Pneumonia [227785]   Estimated Length of Stay: < 2 midnights   Tentative Discharge Plan?: Home or Self Care [1]   Patient Class: Observation [104]             Current Discharge Medication List  Chucky May, MD  02/19/17 2256

## 2017-02-19 NOTE — ED Notes (Signed)
Lunch served, family remains at bedside

## 2017-02-19 NOTE — ED Notes (Signed)
Patient appears to have increased SOB

## 2017-02-19 NOTE — Plan of Care (Signed)
Problem: Moderate/High Fall Risk Score >5  Goal: Patient will remain free of falls  Outcome: Progressing   02/19/17 2342   OTHER   High (Greater than 13) HIGH-Activate bed/chair exit alarm where available;HIGH-Apply yellow "Fall Risk" arm band;HIGH-(VH Only) Yellow slippers

## 2017-02-19 NOTE — H&P (Signed)
ADMISSION HISTORY AND PHYSICAL EXAM    Date Time: 02/19/2017 3:34 PM  Patient Name: Jodi Cox  Attending Physician: Chucky May, MD      History of Present Illness:   Jodi Cox is a 83 y.o. female who presents to the hospital with complaints of cough and mild shortness of breath.  She has been in town for a family reunion and awoke this morning having some right-sided, pleuritic chest discomfort accompanied by a cough productive of clear sputum.  She's had some very mild shortness of breath.  Her daughter is with her and states that her mother has had "asthma" as an intermittent problem.  The patient does admit to smoking history in regards to which she apparently started in her teenage years and her daughter says she quit maybe 20 years ago.  The patient denies any other upper respiratory infection symptoms currently.  The patient's daughter says that she has been a little confused today.    Past Medical History:     Past Medical History:   Diagnosis Date   . Arthritis    . Dementia    . Hypertension         Asthma (versus COPD)    Past Surgical History:   Appendectomy as a teenager. Hysterectomy due to bleeding.    Family History:   No family history on file.    Social History:     Social History     Social History   . Marital status: Widowed     Spouse name: N/A   . Number of children: N/A   . Years of education: N/A     Social History Main Topics   . Smoking status: As above   . Smokeless tobacco:    . Alcohol use    . Drug use:    . Sexual activity:      Other Topics Concern   . Not on file     Social History Narrative   . No narrative on file       Allergies:   No Known Allergies    Medications:   Amlodipine 5 mg daily  Baby aspirin 81 mg daily  Isosorbide dinitrate 5 mg every morning    Review of Systems:   A comprehensive review of systems was:   General ROS: positive for  - fatigue  negative for - chills, fever or night sweats  Respiratory ROS: negative for - hemoptysis  Cardiovascular  ROS: negative for - murmur, orthopnea or palpitations  Gastrointestinal ROS: no abdominal pain, change in bowel habits, or black or bloody stools  Genito-Urinary ROS: positive for - incontinence  negative for - dysuria or hematuria  Musculoskeletal ROS: negative  Neurological ROS: no TIA or stroke symptoms    Physical Exam:     Vitals:    02/19/17 1517   BP: 126/68   Pulse: 91   Resp: 18   Temp: 98.7 F (37.1 C)   SpO2: 99%       Intake and Output Summary (Last 24 hours) at Date Time  No intake or output data in the 24 hours ending 02/19/17 1534    GENERAL: No acute distress, awake, alert and speech intelligible  NECK: no noted lymphadenopathy, no carotid bruits and 2+ pulse; thyroid is normal in size and without nodules  CV: S1 and S2 Regular rate and rhythem, no murmurs noted, no S3 or S4 or lift   CHEST: clear to ascultation and percussion bilaterally except faint end expiratory wheezing  with forced exhalation  ABD: soft non-tender, no distention x4, +bowel sounds, no HSM or mass  EXTR: warm, +2 dorsalis pedis bilaterally, no pitting edema bilaterally?   Neuro:  Mental status unremarkable except for mild confusion; PERRLA, uvula and tongue in the midline, face symmetric; motor function normal    Labs:     Results     Procedure Component Value Units Date/Time    B-type Natriuretic Peptide [[id number]] Collected:  02/19/17 1125    Specimen:  Blood Updated:  02/19/17 1323     B-Natriuretic Peptide 64.2 pg/mL     Urinalysis w Microscopic and Culture if Indicated [[id number]] Collected:  02/19/17 1215    Specimen:  Urine, Random Updated:  02/19/17 1228     Color, UA Yellow     Clarity, UA Clear     Specific Gravity, UR 1.010     pH, Urine 6.0 pH      Protein, UR Negative mg/dL      Glucose, UA Negative mg/dL      Ketones UA Negative mg/dL      Bilirubin, UA Negative mg/dL      Blood, UA Negative mg/dL      Nitrite, UA Negative     Urobilinogen, UA 0.2 mg/dL      Leukocyte Esterase, UA Negative Leu/uL     D-dimer,  quantitative [[id number]]  (Abnormal) Collected:  02/19/17 1130    Specimen:  Blood Updated:  02/19/17 1223     D-Dimer 1.49 (H) mg/L FEU     CBC and differential [[id number]]  (Abnormal) Collected:  02/19/17 1130    Specimen:  Blood from Blood Updated:  02/19/17 1207     WBC 5.3 K/cmm      RBC 4.23 M/cmm      Hemoglobin 11.1 (L) gm/dL      Hematocrit 62.9 (L) %      MCV 78 (L) fL      MCH 26 (L) pg      MCHC 33 gm/dL      RDW 52.8 %      PLT CT 190 K/cmm      MPV 6.8 fL      NEUTROPHIL % 38.0 (L) %      Lymphocytes 50.0 (H) %      Monocytes 11.0 %      Eosinophils % 0.0 %      Basophils % 0.0 %      Bands 1 %      Neutrophils Absolute 2.1 K/cmm      Lymphocytes Absolute 2.7 K/cmm      Monocytes Absolute 0.6 K/cmm      Eosinophils Absolute 0.0 K/cmm      BASO Absolute 0.0 K/cmm      RBC Morphology RBC Morphology Reviewed     Macrocytic 1+     Microcytic 1+     Anisocytosis 1+     Poikilocytosis 1+    Narrative:       Manual differential performed    Troponin I (Stat) [[id number]] Collected:  02/19/17 1130    Specimen:  Plasma Updated:  02/19/17 1200     Troponin I 0.00 ng/mL     Comprehensive metabolic panel [[id number]]  (Abnormal) Collected:  02/19/17 1130    Specimen:  Plasma Updated:  02/19/17 1155     Sodium 140 mMol/L      Potassium 3.8 mMol/L      Chloride 106 mMol/L      CO2 26.0 mMol/L  Calcium 9.4 mg/dL      Glucose 94 mg/dL      Creatinine 1.61 mg/dL      BUN 18 mg/dL      Protein, Total 8.2 gm/dL      Albumin 3.4 (L) gm/dL      Alkaline Phosphatase 69 U/L      ALT 8 U/L      AST (SGOT) 14 U/L      Bilirubin, Total 0.5 mg/dL      Albumin/Globulin Ratio 0.71 Ratio      Anion Gap 12.2 mMol/L      BUN/Creatinine Ratio 21.4 Ratio      EGFR 58 (L) mL/min/1.23m2      Osmolality Calc 282 mOsm/kg      Globulin 4.8 (H) gm/dL           Rads:   Radiological Procedure reviewed:    Chest x-ray is largely unremarkable    Chest CT shows possible right base infiltrate; no PE      Assessment/Plan:     1. Right lower lobe  pneumonia    2. Asthmatic bronchitis versus possible underlying COPD with exacerbation    3. Hypertension, controlled      Plan:  Intravenous Solu-Medrol.  Intravenous ceftriaxone and azithromycin.  DuoNeb every 6 hours.  Hopefully, she will resolve quickly as she and her family would like her to rejoin the reunion activities soon.      Signed by: Richrd Sox.

## 2017-02-20 ENCOUNTER — Encounter: Payer: Self-pay | Admitting: Internal Medicine

## 2017-02-20 DIAGNOSIS — J45909 Unspecified asthma, uncomplicated: Secondary | ICD-10-CM

## 2017-02-20 MED ORDER — SODIUM CHLORIDE 0.9 % IV SOLN
500.0000 mg | INTRAVENOUS | Status: DC
Start: 2017-02-20 — End: 2017-02-20
  Administered 2017-02-20: 13:00:00 500 mg via INTRAVENOUS
  Filled 2017-02-20: qty 500
  Filled 2017-02-20: qty 250

## 2017-02-20 MED ORDER — AZITHROMYCIN 250 MG PO TABS
250.0000 mg | ORAL_TABLET | Freq: Every day | ORAL | 0 refills | Status: AC
Start: 2017-02-20 — End: 2017-02-23

## 2017-02-20 MED ORDER — METHYLPREDNISOLONE SODIUM SUCC 125 MG IJ SOLR
125.0000 mg | Freq: Once | INTRAMUSCULAR | Status: AC
Start: 2017-02-20 — End: 2017-02-20
  Administered 2017-02-20: 12:00:00 125 mg via INTRAVENOUS
  Filled 2017-02-20: qty 2

## 2017-02-20 MED ORDER — ALBUTEROL-IPRATROPIUM 2.5-0.5 (3) MG/3ML IN SOLN
3.0000 mL | Freq: Four times a day (QID) | RESPIRATORY_TRACT | 3 refills | Status: AC | PRN
Start: 2017-02-20 — End: ?

## 2017-02-20 MED ORDER — PREDNISONE 20 MG PO TABS
40.0000 mg | ORAL_TABLET | Freq: Every day | ORAL | 0 refills | Status: AC
Start: 2017-02-20 — End: ?

## 2017-02-20 MED ORDER — SODIUM CHLORIDE 0.9 % IV MBP
1.0000 g | INTRAVENOUS | Status: DC
Start: 2017-02-20 — End: 2017-02-20
  Administered 2017-02-20: 12:00:00 1 g via INTRAVENOUS
  Filled 2017-02-20: qty 100
  Filled 2017-02-20: qty 1000

## 2017-02-20 NOTE — Discharge Summary (Signed)
Discharge Summary    Date:02/20/2017   Patient Name: Jodi Cox,Jodi Cox  Attending Physician: Richrd Sox.*    Date of Admission:   02/19/2017    Date of Discharge:   02/20/2017    Admitting Diagnosis:   Pneumonia with asthma versus COPD exacerbation    Discharge Dx:     Principal Diagnosis (Diagnosis after study, that is chiefly responsible for admission to inpatient status): Pneumonia with asthma versus COPD exacerbation    Active Hospital Problems    Diagnosis POA   . Acute asthma Unknown   . Pneumonia Yes      Resolved Hospital Problems    Diagnosis POA   No resolved problems to display.       Treatment Team:   Treatment Team:   Attending Provider: Richrd Sox., MD     Procedures performed:   Radiology: all results from this admission  Ct Angiogram Chest    Result Date: 02/19/2017  1.  No evidence of pulmonary embolism, with evaluation of the posterior right base markedly limited by motion artifact. 2.  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 3.  Right lower lobe atelectasis versus inflammation. 4.  Enlarged main pulmonary artery, suggesting pulmonary arterial hypertension. 5.  Mild age indeterminate T12 compression fracture, likely subacute or older: Clinical correlation is recommended. 6.  Aortic valve calcification. ReadingStation:[id number]    Xr Chest Ap Portable    Result Date: 02/19/2017  Atherosclerosis with a dilated or tortuous aorta. ReadingStation:[id number]      Reason for Admission:   Dyspnea, cough, right lower lobe pneumonia on chest CT scan  Hospital Course:     Patient was admitted and treated with intravenous ceftriaxone and azithromycin as well as intravenous Solu-Medrol. She had DuoNeb jet nebulizer treatments every 6 hours.    Condition at Discharge:   Good. Her breathing was back at baseline. She is moving air well  despite having some end expiratory wheezing. She was able to ambulate in the hall 30 feet and back.  Today:     BP 143/63   Pulse 92   Temp 97.6 F (36.4 C) (Tympanic)   Resp 20   Ht 1.6 m (5\' 3" )   Wt 54.3 kg (119 lb 11.2 oz)   SpO2 95%   BMI 21.20 kg/m   Ranges for the last 24 hours:  Temp:  [97.6 F (36.4 C)-98.7 F (37.1 C)] 97.6 F (36.4 C)  Heart Rate:  [85-103] 92  Resp Rate:  [17-20] 20  BP: (122-143)/(57-76) 143/63     Lungs:  Fair air movement, right base rales, mild end expiratory wheezing diffusely, improved from yesterday.    Last set of labs     Recent Labs  Lab 02/19/17  1130   WBC 5.3   Hemoglobin 11.1*   Hematocrit 33.1*   PLT CT 190       Recent Labs  Lab 02/19/17  1130   Sodium 140   Potassium 3.8   Chloride 106   CO2 26.0   BUN 18   Creatinine 0.84   EGFR 58*   Glucose 94   Calcium 9.4       Micro / Labs / Path pending:     Unresulted Labs     None          Discharge Instructions For Providers  1. Patient should try to see a primary care doctor in the next 2 weeks.  She lives in [place].          Discharge Instructions:       Discharge Diet: 4 Gram Sodium (Salt) Restricted Diet        Nothing new  Disposition:        Discharge Medication List      Taking    albuterol-ipratropium 2.5-0.5(3) mg/3 mL nebulizer  Dose:  3 mL  Commonly known as:  DUO-NEB  Take 3 mLs by nebulization every 6 (six) hours as needed (Dyspnea).     amLODIPine 5 MG tablet  Dose:  5 mg  Commonly known as:  NORVASC  Take 5 mg by mouth daily.     aspirin EC 81 MG EC tablet  Dose:  81 mg  Take 81 mg by mouth daily.     azithromycin 250 MG tablet  Dose:  250 mg  Commonly known as:  ZITHROMAX  For:  Community Acquired Pneumonia  Take 1 tablet (250 mg total) by mouth daily.for 3 days     isosorbide dinitrate 5 MG tablet  Dose:  5 mg  Commonly known as:  ISORDIL  Take 5 mg by mouth every morning.     predniSONE 20 MG tablet  Dose:  40 mg  Commonly known as:  DELTASONE  For:  Asthma  Take 2 tablets (40 mg total)  by mouth daily.2 pills this p.m. Then 3/d x 3 ds, then 2/d x 3 ds, then 1/d x 3 ds then 1/2 daily for 4 ds.          Minutes spent coordinating discharge and reviewing discharge plan: 25 minutes      Signed by: Richrd Sox., MD

## 2017-02-20 NOTE — Progress Notes (Signed)
Discharge instructions given to pt and daughter. Both verbalize understanding and state no questions.

## 2017-02-20 NOTE — Plan of Care (Signed)
Problem: Inadequate Gas Exchange  Goal: Adequate oxygenation and improved ventilation  Outcome: Progressing   02/20/17 0726   Goal/Interventions addressed this shift   Adequate oxygenation and improved ventilation Assess lung sounds;Monitor SpO2 and treat as needed;Consult/collaborate with Respiratory Therapy

## 2017-02-20 NOTE — Progress Notes (Signed)
Pt discharged to family with all belongings. Out to car via wheelchair.

## 2017-02-20 NOTE — Progress Notes (Signed)
83 yo female who admitted to observation status with diagnosis of pneumonia. Upon entry into room,patient resting with eyes closed but easily awakened to the sound of foot steps. Patient is alert and oriented to person, place, event. Patient states she doesn't keep up with the date but answered all questions appropriately. Patient states that she is currently living with her daughter, Arlester Marker in [place]. They are currently in town for a family reunion. Patient voices that she would eventually like to move back to her home in [place]. Patient states she is able to bath/dress/groom/feed self but her daughter is responsible for the cooking/cleaning/laundry/grocery shopping/transportation. Patient is noted to have [facility] HMO and voices no concern with affordability of medications. Patient states she has a cane and FWW that she uses to assist with ambulation. She states she uses FWW a majority of the time and uses cane in bathroom, as FWW will not fit. Patient voices at time of discharge, her daughter will provide transportation. Patient has given me permission to speak with daughter regarding her care or any questions that I might have. Talked with patient some about obtaining HH services in [place]. She states, "I don't think I need that". Told patient that if she changes her mind, she could call her PCP in [place] and they can arrange services. Understanding voiced. Patient is receptive to [employer]. A referral has been placed in [place] and CM has spoke with Wardell Honour about this case. CM will continue to assess needs and intervene as indicated, while patient is at [facility].     02/20/17 1149   Patient Type   Within 30 Days of Previous Admission? Yes   Healthcare Decisions   Interviewed: Patient   Orientation/Decision Making Abilities of Patient Alert and Oriented x3, able to make decisions  (unable to keep up with date; however, patient was oriented to person, place and situation. All questions  answered appropriately. )   Healthcare Agent Appointed Yes   Healthcare Agent's Name Shirlee Limerick   Healthcare Agent's Phone Number [phone]   Prior to admission   Prior level of function Ambulates with assistive device   Living Arrangements Children   How do you get to your MD appointments? family   How do you get your groceries? family   Who fixes your meals? family   Who does your laundry? family   Who picks up your prescriptions? family   Dressing Independent   Grooming Independent   Feeding Independent   Bathing Independent   Toileting Independent   DME Currently at Home Single point cane;Front wheel walker   Discharge Planning   Support Systems Children;Family members   [place]   Anticipated [facility] plan discussed with: Same as interviewed   Mode of transportation: Private car (family member)   Consults/Providers   PT Evaluation Needed 1   Important Message from Medicare Notice   Patient received 1st IMM Letter? n/a

## 2017-02-20 NOTE — UM Notes (Signed)
[facility] Utilization Management Review Sheet    Facility :  [facility]    NAMEMadlynn Cox  MR#: [id number]    CSN#: [id number]    ROOM: 217/217-A AGE: 83 y.o.    START OF SERVICE DATE AND TIME: 02/19/2017 11:18 AM  ADMIT DATE AND TIME: 02/19/17 1547    PATIENT CLASS: OBSERVATION    ATTENDING PHYSICIAN: Richrd Sox.*  PAYOR:Payor: MEDICARE HMO / Plan: MEDICARE ADVANTAGE GENERIC / Product Type: MANAGED MEDICARE /       AUTH #: [id number]    DIAGNOSIS:     ICD-10-CM    1. Pneumonia of right lower lobe due to infectious organism J18.1    2. Acute asthma J45.909        HISTORY:   Past Medical History:   Diagnosis Date   . Arthritis    . Dementia    . Hypertension        DATE OF REVIEW: 02/20/2017    VITALS: BP 143/63   Pulse 92   Temp 97.6 F (36.4 C) (Tympanic)   Resp 20   Ht 1.6 m (5\' 3" )   Wt 54.3 kg (119 lb 11.2 oz)   SpO2 95%   BMI 21.20 kg/m     Active Hospital Problems    Diagnosis   . Pneumonia     To the emergency department with chief complaint of confusion.  Patient is noted to be visiting the area for a family reunion and was noted to become confused last night.  This morning patient appears to be short of breath, confusion worse than her baseline, and some wheezing. Patient is noted to have a cough.    ED Vital Signs:   BP: 143/79, Heart Rate: 79, Temp: 99 F (37.2 C), Resp Rate: 16, SpO2: 96 %, Weight: 59 kg    ED Treatment:  Zithromax 500 mg Iv, Albuterol neb, Rocephin 1 gram iv    ED Diagnostics:  BNP 64.2, Urine:  Negative, D Dimer 1.49, WBC 5.3, H/H 11.1/33.1, troponin 0.00, NA 140, K+ 3.8, Glucose 94, Bun 18, Cr 0.84, CT Chest:  No evidence of PE, right lower lobe atelectasis vs inflammation, Chest X-ray:  Atherosclerosis with a dilated or tortuous aorta    Orders:  Observation, vital signs every 8 hours, PT to evaluate and treat, SCD's, Oxygen at 2 liters to titrate to maintain SPO2 greater than 92%, cardiac diet, Solu Medrol 125 mg iv x 1 dose and then 40 mg every 12  hours iv, Lovenox 40 mg daily subcutaneous, Rocephin 1 gram Iv daily, Zithromax 500 mg daily Iv x 4 doses, Duo neb every 6 hours scheduled    UR Continued Stay Review Note for 02/20/17:    According to Dr. Roberto Scales rounding note dated 02/20/17 "Patient feels better today.  Her dyspnea is better and she's had no recurrence of the right chest pain, although she does note some alteration of her breathing if she lays on her right side and so she's been avoiding that.  She has had some clear sputum production, no purulence."  Few rales but diffuse end expiratory wheezing noted bilaterally.    Vital Signs:  Temperature 97.6, pulse 92, respirations 20, blood pressure 143/63, SPO2 95% on room air    Diagnostics:  No recent diagnostics on 02/20/17    Plan according to Dr. Roberto Scales rounding note dated 02/20/17 "Physical therapy consult to ambulate the patient and note her breathing pattern when ambulating.  Solu-Medrol 125 mg IV now.  We'll give her her once daily dose of ceftriaxone and azithromycin a little earlier today then this afternoon.  I will see her again around lunchtime to see if she might be able to be discharged.  She's eager to leave the hospital because of this current family reunion that she has going on with her family."    Mcg 21st Edition:  OC-030 for altered mental status (patient noted with confusion that is worse than her baseline), need for Iv medications (patient is receiving Solu Medrol 125 mg iv x 1 dose and then 40 mg every 12 hours iv, Rocephin 1 gram Iv daily, Zithromax 500 mg daily Iv x 4 doses) Duo neb every 6 hours scheduled, PT to evaluate and treat

## 2017-02-20 NOTE — Plan of Care (Signed)
Goals met 

## 2017-02-20 NOTE — Progress Notes (Signed)
In to speak with patient's daughter, Arlester Marker. Ms. Joseph Art inquiring about [facility] for patient once she returns back to [facility]. Informed Ms. Woods to contact patient's PCP and request [facility] services. Ms. Joseph Art more interested in personal care services; however, patient does not qualify for MCD services at this time(per daughter). Daughter states that they do have a privately paid sitter that comes to sit with patient for a few hours per week. All questions/information provided to patient/daughter with understanding voiced.

## 2017-02-20 NOTE — Progress Note - Problem Oriented Charting Notewrit (Signed)
PROGRESS NOTE    Date Time: 02/20/2017 10:04 AM  Patient Name: Jodi Cox,Jodi Cox    Subjective:   Patient feels better today.  Her dyspnea is better and she's had no recurrence of the right chest pain, although she does note some alteration of her breathing if she lays on her right side and so she's been avoiding that.  She has had some clear sputum production, no purulence.    Past Medical History:   Diagnosis Date   . Arthritis    . Dementia    . Hypertension      Medications:     Current Facility-Administered Medications   Medication Dose Route Frequency   . albuterol-ipratropium  3 mL Nebulization Q6H SCH   . amLODIPine  5 mg Oral Daily   . aspirin EC  81 mg Oral Daily   . azithromycin  500 mg Intravenous Q24H   . cefTRIAXone  1 g Intravenous Q24H   . docusate sodium  100 mg Oral Daily   . enoxaparin  40 mg Subcutaneous Q24H   . isosorbide dinitrate  5 mg Oral QAM   . lactobacillus species  50 Billion CFU Oral Daily   . methylPREDNISolone  40 mg Intravenous Q12H   . senna-docusate  2 tablet Oral QHS   . sodium chloride (PF)  3 mL Intravenous Q8H      Review of Systems:  Good appetite    Physical Exam:     Vitals:    02/20/17 0913   BP: 143/63   Pulse:    Resp:    Temp:    SpO2:        Intake and Output Summary (Last 24 hours) at Date Time    Intake/Output Summary (Last 24 hours) at 02/20/17 1004  Last data filed at 02/20/17 0829   Gross per 24 hour   Intake              710 ml   Output                0 ml   Net              710 ml       GENERAL: No acute distress, alert, Awake and normal speech  CV: S1 and S2 Regular rate and rhythem, no murmurs noted, no S3 or S4, no lift  CHEST: Good air movement but diffuse end expiratory wheezing bilaterally; a few rales at the right base?  ABD: soft non-tender, no distention x4, +bowel sounds     Labs:     Results     Procedure Component Value Units Date/Time    B-type Natriuretic Peptide [[id number]] Collected:  02/19/17 1125    Specimen:  Blood Updated:  02/19/17 1323      B-Natriuretic Peptide 64.2 pg/mL     Urinalysis w Microscopic and Culture if Indicated [[id number]] Collected:  02/19/17 1215    Specimen:  Urine, Random Updated:  02/19/17 1228     Color, UA Yellow     Clarity, UA Clear     Specific Gravity, UR 1.010     pH, Urine 6.0 pH      Protein, UR Negative mg/dL      Glucose, UA Negative mg/dL      Ketones UA Negative mg/dL      Bilirubin, UA Negative mg/dL      Blood, UA Negative mg/dL      Nitrite, UA Negative     Urobilinogen, UA 0.2 mg/dL  Leukocyte Esterase, UA Negative Leu/uL     D-dimer, quantitative [[id number]]  (Abnormal) Collected:  02/19/17 1130    Specimen:  Blood Updated:  02/19/17 1223     D-Dimer 1.49 (H) mg/L FEU     CBC and differential [[id number]]  (Abnormal) Collected:  02/19/17 1130    Specimen:  Blood from Blood Updated:  02/19/17 1207     WBC 5.3 K/cmm      RBC 4.23 M/cmm      Hemoglobin 11.1 (L) gm/dL      Hematocrit 59.5 (L) %      MCV 78 (L) fL      MCH 26 (L) pg      MCHC 33 gm/dL      RDW 63.8 %      PLT CT 190 K/cmm      MPV 6.8 fL      NEUTROPHIL % 38.0 (L) %      Lymphocytes 50.0 (H) %      Monocytes 11.0 %      Eosinophils % 0.0 %      Basophils % 0.0 %      Bands 1 %      Neutrophils Absolute 2.1 K/cmm      Lymphocytes Absolute 2.7 K/cmm      Monocytes Absolute 0.6 K/cmm      Eosinophils Absolute 0.0 K/cmm      BASO Absolute 0.0 K/cmm      RBC Morphology RBC Morphology Reviewed     Macrocytic 1+     Microcytic 1+     Anisocytosis 1+     Poikilocytosis 1+    Narrative:       Manual differential performed    Troponin I (Stat) [[id number]] Collected:  02/19/17 1130    Specimen:  Plasma Updated:  02/19/17 1200     Troponin I 0.00 ng/mL     Comprehensive metabolic panel [[id number]]  (Abnormal) Collected:  02/19/17 1130    Specimen:  Plasma Updated:  02/19/17 1155     Sodium 140 mMol/L      Potassium 3.8 mMol/L      Chloride 106 mMol/L      CO2 26.0 mMol/L      Calcium 9.4 mg/dL      Glucose 94 mg/dL      Creatinine 3.01 mg/dL      BUN 18 mg/dL       Protein, Total 8.2 gm/dL      Albumin 3.4 (L) gm/dL      Alkaline Phosphatase 69 U/L      ALT 8 U/L      AST (SGOT) 14 U/L      Bilirubin, Total 0.5 mg/dL      Albumin/Globulin Ratio 0.71 Ratio      Anion Gap 12.2 mMol/L      BUN/Creatinine Ratio 21.4 Ratio      EGFR 58 (L) mL/min/1.14m2      Osmolality Calc 282 mOsm/kg      Globulin 4.8 (H) gm/dL             Rads:   Radiological Procedure reviewed.      Assessment/Plan:   1. Right lower lobe pneumonia    2. Asthmatic bronchitis versus possible underlying COPD with exacerbation, improving, with better air movement.    3. Hypertension, controlled      Plan:  Physical therapy consult to ambulate the patient and note her breathing pattern when ambulating.  Solu-Medrol 125 mg IV now.  We'll give  her her once daily dose of ceftriaxone and azithromycin a little earlier today then this afternoon.  I will see her again around lunchtime to see if she might be able to be discharged.  She's eager to leave the hospital because of this current family reunion that she has going on with her family.      Signed by: Richrd Sox., MD

## 2017-02-20 NOTE — PT Eval Note (Signed)
ATTENTION Provider(s):  Thank you for allowing [place] to participate in the care of  Jodi Cox.  Regulations from the [employer] Medicaid Services (CMS) require your review and approval for this Plan of Care.  Please co-sign this note indicating you are in agreement with the Physical Therapy Plan of Care.      [facility]:  [facility]  Department of Rehabilitation Services  [phone]  Maudean Hoffmann    CSN: [id number]    MEDICAL/SURGICAL   217/217-A    Physical Therapy Evaluation    Time of treatment:  Time Calculation  PT Received On: 02/20/17  Start Time: 1140  Stop Time: 1215  Time Calculation (min): 35 min    Visit#:   1                                                                                Precautions and Contraindications:   Falls    Clinical Presentation and Decision Making     PT Assessment:  Jodi Cox was admitted 02/19/2017 with c/o cough and mild shortness of breath per MD note. Pt diagnosed with pneumonia and asthmatic bronchitis vs possible underlying COPD with exacerbation per MD note. Today pt denies shortness of breath with exception of when lying supine which resolved within 1 minute of lying flat. Pt able to rise from bed and ambulate 60' in the hallway with a walker. Gait steady and continuous. SpO2 stable on room air at 96% with ambulation. Per family present, pt ambulates only short distances at home, 20-30', due to becoming short of breath with longer distances. Pt lives with her daughter who indirectly supervises pt during all functional mobility. Pt and family are hopeful for discharge later today in order to join in on family reunion. Family present provided with recommendation for stand-by assistance with all functional mobility to maximize pt safety at this time. No further skilled inpatient PT needs.     Patient presenting with the following PT Impairments:Appears to be at baseline for mobility     Due to the presence of no treatment options and  1-2 comorbidities or personal factors that affect performance, as well as patient's stable and/or uncomplicated characteristics, modifications were NOT necessary to complete evaluation when examining total of 4 or more elements (includes body structures and functions, activity limitations and/or participation restrictions) determines the degree of complexity for this patient is LOW    Rehabilitation Potential:Not a PT candidate    Discussed risk, benefits and Plan of Care with: Patient, Family    DISCHARGE RECOMMENDATIONS   DME recommended for Discharge:   Has needed equipment (has FWW)    Discharge Recommendations:       None    Development of Plan of Care:     Goals:    N/A    Treatment/interventions: No skilled PT interventions needed at this time.    Treatment Frequency:  N/A    History:   History of Present Illness:    Medical Diagnosis: Pneumonia [J18.9]  Acute asthma [J45.909]  Pneumonia of right lower lobe due to infectious organism [J18.1]    Jodi Cox is a 83 y.o. female admitted on 02/19/2017 with pneumonia.  Patient Active Problem List   Diagnosis   . Pneumonia   . Acute asthma        X-Rays/Tests/Labs:  Ct Angiogram Chest    Result Date: 02/19/2017  1.  No evidence of pulmonary embolism, with evaluation of the posterior right base markedly limited by motion artifact. 2.  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 3.  Right lower lobe atelectasis versus inflammation. 4.  Enlarged main pulmonary artery, suggesting pulmonary arterial hypertension. 5.  Mild age indeterminate T12 compression fracture, likely subacute or older: Clinical correlation is recommended. 6.  Aortic valve calcification. ReadingStation:[id number]    Xr Chest Ap Portable    Result Date: 02/19/2017  Atherosclerosis with a dilated or tortuous aorta.  ReadingStation:[id number]        Past Medical/Surgical History:  Past Medical History:   Diagnosis Date   . Acute asthma    . Arthritis    . Dementia    . Hypertension       Past Surgical History:   Procedure Laterality Date   . APPENDECTOMY     . HYSTERECTOMY     . SPINE SURGERY           Social History:    Home Living Arrangements:  Living Arrangements: Children  Assistance Available: 24 hour supervision  Type of Home: House, a few stairs to enter with rail    Prior Level of Function:  Household ambulation, indirect supervision from daughter, when climbing stairs daughter provides stand-by assistance for safety   Fall history: yes    DME available at home:  Front wheeled walker  Single point cane    Subjective     Patient is agreeable to participation in the therapy session.       Pain:  At Rest: 0/10  With Activity: 0/10  Location: N/A  Interventions: None required    Examination of Body Systems (Structures, Function, Activity and Participation)   Patient's medical condition is appropriate for Physical therapy intervention at this time    Observation of patient:  Patient is in bed with peripheral IV in place.    Cognition:  Command following: Follows 1 step commands without difficulty    Vital Signs (Cardiovascular):  BP Supine:  133/68 mmHg  HR Supine: 92 bpm  SpO2 at rest: 93-94%  SpO2 with activity: 96%  on room air            Balance:  Static Sitting:  Good  Dynamic Sitting:  Good  Static Standing:  Fair  Dynamic Standing:  Fair            Musculoskeletal Examination:            Range of motion:  Grossly WFLs       Strength:  Grossly WFLs         Functional Mobility:    Bed Mobility:  Supine to Sit:   Independent.          Transfers:  Sit to Stand:  Modified independence  with Front wheeled walker.         Stand to Sit:  Modified independence .           Locomotion:  LEVEL AMBULATION:  Distance: 62'   Assistance level:  Supervision, assistance provided with IV pole, cues for navigation in hospital  setting  Device:  Front wheeled walker  Pattern:  Reciprocal, Step through    AM-PACT "6 Clicks" Basic Mobility Inpatient Short Form  Turning Over in Bed: None  Sitting Down On/Standing From Armchair: None  Lying on Back to Sitting on Side of Bed: None  Assist Moving to/from Bed to Chair: None  Assist to Walk in Hospital Room: None  Assist to Climb 3-5 Steps with Railing: A little  PT Basic Mobility Raw Score: 23  CMS 0-100% Score: 11.20% Mobility G Code Set  Mobility, Current Status (B1478): At least 1 percent but less than 20 percent impaired, limited or restricted  Mobility, Goal Status (G9562): At least 1 percent but less than 20 percent impaired, limited or restricted  Mobility, D/C Status (Z3086): At least 1 percent but less than 20 percent impaired, limited or restricted  Tools used to determine level of impairment: AM-PACT "6 Clicks" Basic Mobility Score               Participation and Activity Tolerance:  Participation effort: Good  Activity Tolerance: Activity tolerance does not limit participation    Treatment Interventions this session:   Evaluation    Education Provided:   TOPICS: role of physical therapy, recommendation for stand-by assistance during functional mobility for max safety     Learner educated: Patient, Family  Method: Explanation  Response to education: Verbalized understanding    Patient Position at End of Treatment:   Sitting, in a chair, Family/visitors present, Needs in reach, Bed/chair alarm set and No distress    Team Communication:     Spoke to: RN/LPN - Will, Physician/NP/PA -  Dr. Richarda Osmond   Regarding: Patient participation with Therapy, Vital signs  Whiteboard updated: Yes  PT/PTA communication: via written note and verbal communication as needed.      Alvino Chapel, DPT

## 2018-01-20 IMAGING — CR DG FOOT COMPLETE 3+V*L*
1 series · 3 of 3 positions shown · non-contrast
Comparison: None.

CLINICAL DATA: [AGE] female with a history of left foot pain
and no known injury

EXAM:
LEFT FOOT - COMPLETE 3+ VIEW

[Series 1: ap · 0.17mm/px · 3 of 3 slices shown]
[im 1/3]
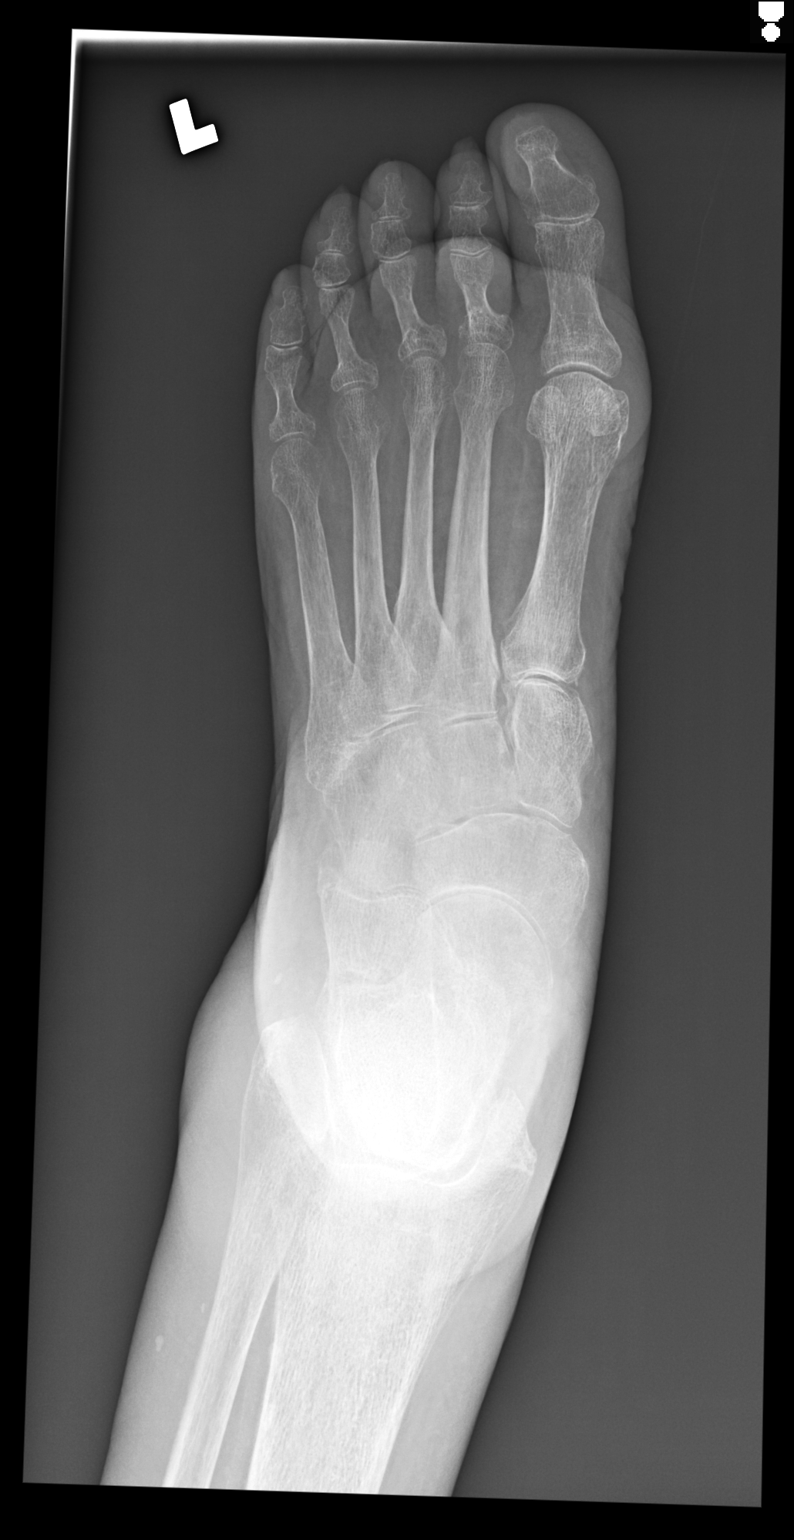
[im 2/3]
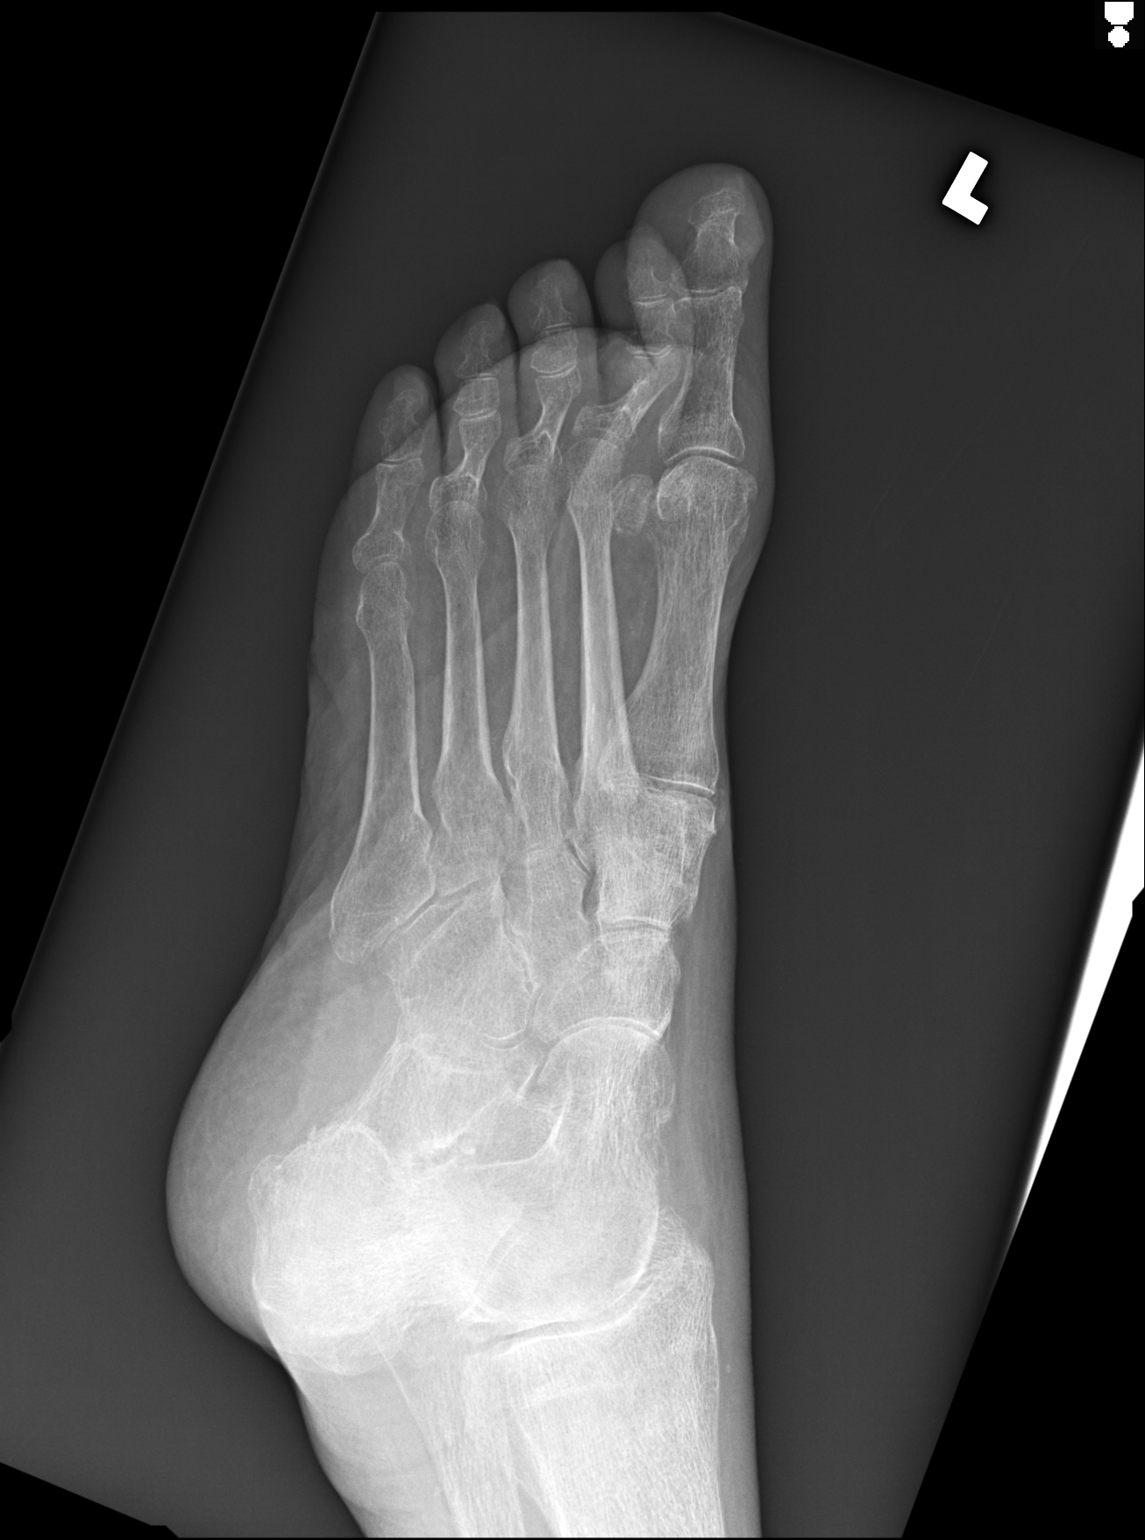
[im 3/3]
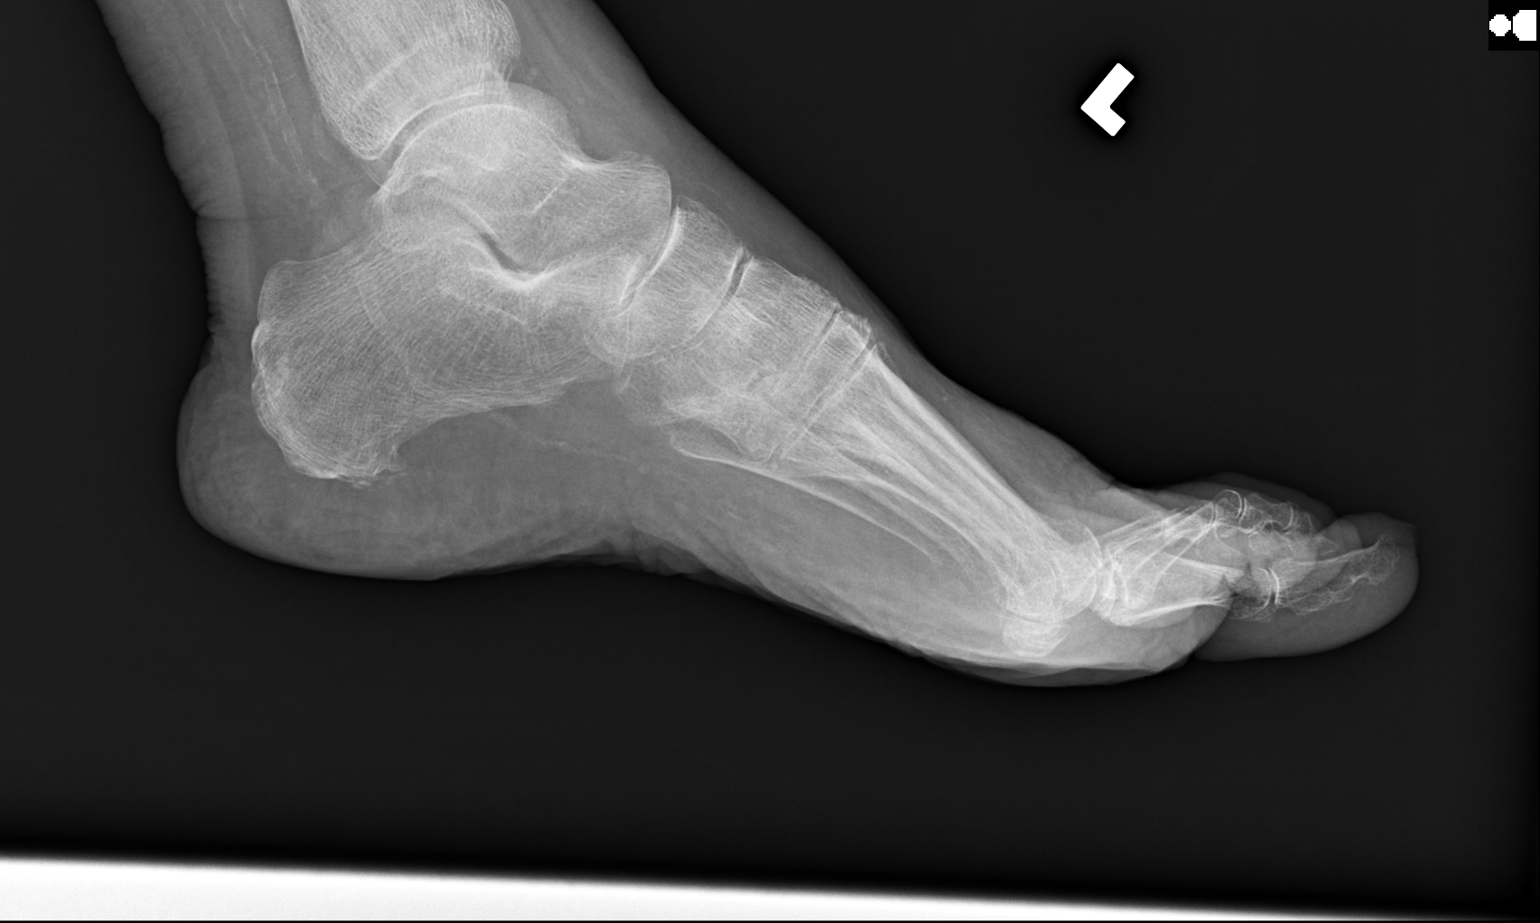

[3 of 3 positions shown; findings below may reference images not displayed]

FINDINGS: Osteopenia. No acute displaced fracture. Degenerative changes of the
interphalangeal joints. No subluxation/ dislocation.

Dense calcifications of the tibial and pedal vessels.

Degenerative changes of the hindfoot and midfoot.
IMPRESSION: Osteopenia, with no evidence of acute bony abnormality.

Calcifications of the tibial and pedal vessels. Correlation with
noninvasive arterial imaging may be useful if there is a concern for
critical limb ischemia as an etiology of foot pain.

## 2018-06-28 DEATH — deceased
